# Patient Record
Sex: Female | Born: 1935 | Hispanic: No | Smoking: Never smoker
Health system: Southern US, Community
[De-identification: ages and names within clinical notes are randomized; demographics above are authoritative.]

## PROBLEM LIST (undated history)

## (undated) DIAGNOSIS — F329 Major depressive disorder, single episode, unspecified: Secondary | ICD-10-CM

## (undated) DIAGNOSIS — I10 Essential (primary) hypertension: Secondary | ICD-10-CM

## (undated) DIAGNOSIS — R634 Abnormal weight loss: Secondary | ICD-10-CM

## (undated) DIAGNOSIS — F419 Anxiety disorder, unspecified: Secondary | ICD-10-CM

## (undated) DIAGNOSIS — F0390 Unspecified dementia without behavioral disturbance: Secondary | ICD-10-CM

## (undated) DIAGNOSIS — R413 Other amnesia: Secondary | ICD-10-CM

## (undated) DIAGNOSIS — R269 Unspecified abnormalities of gait and mobility: Secondary | ICD-10-CM

## (undated) DIAGNOSIS — N189 Chronic kidney disease, unspecified: Secondary | ICD-10-CM

## (undated) DIAGNOSIS — F32A Depression, unspecified: Secondary | ICD-10-CM

## (undated) DIAGNOSIS — K810 Acute cholecystitis: Secondary | ICD-10-CM

## (undated) DIAGNOSIS — R42 Dizziness and giddiness: Secondary | ICD-10-CM

## (undated) DIAGNOSIS — B353 Tinea pedis: Secondary | ICD-10-CM

## (undated) HISTORY — PX: CATARACT EXTRACTION: SUR2

## (undated) HISTORY — DX: Major depressive disorder, single episode, unspecified: F32.9

## (undated) HISTORY — DX: Tinea pedis: B35.3

## (undated) HISTORY — DX: Essential (primary) hypertension: I10

## (undated) HISTORY — DX: Abnormal weight loss: R63.4

## (undated) HISTORY — DX: Depression, unspecified: F32.A

## (undated) HISTORY — DX: Unspecified abnormalities of gait and mobility: R26.9

## (undated) HISTORY — DX: Anxiety disorder, unspecified: F41.9

## (undated) HISTORY — DX: Dizziness and giddiness: R42

## (undated) HISTORY — DX: Other amnesia: R41.3

## (undated) HISTORY — PX: TUBAL LIGATION: SHX77

---

## 2000-04-27 ENCOUNTER — Encounter: Payer: Self-pay | Admitting: Cardiology

## 2000-04-27 ENCOUNTER — Ambulatory Visit (HOSPITAL_COMMUNITY): Admission: RE | Admit: 2000-04-27 | Discharge: 2000-04-27 | Payer: Self-pay | Admitting: Cardiology

## 2001-01-17 ENCOUNTER — Ambulatory Visit (HOSPITAL_COMMUNITY): Admission: RE | Admit: 2001-01-17 | Discharge: 2001-01-17 | Payer: Self-pay | Admitting: Cardiology

## 2001-01-17 ENCOUNTER — Encounter: Payer: Self-pay | Admitting: Cardiology

## 2001-04-09 ENCOUNTER — Encounter: Payer: Self-pay | Admitting: Cardiology

## 2001-04-09 ENCOUNTER — Ambulatory Visit (HOSPITAL_COMMUNITY): Admission: RE | Admit: 2001-04-09 | Discharge: 2001-04-09 | Payer: Self-pay | Admitting: Cardiology

## 2001-12-06 ENCOUNTER — Encounter: Payer: Self-pay | Admitting: Cardiology

## 2001-12-06 ENCOUNTER — Ambulatory Visit (HOSPITAL_COMMUNITY): Admission: RE | Admit: 2001-12-06 | Discharge: 2001-12-06 | Payer: Self-pay | Admitting: Cardiology

## 2003-01-03 ENCOUNTER — Ambulatory Visit (HOSPITAL_COMMUNITY): Admission: RE | Admit: 2003-01-03 | Discharge: 2003-01-03 | Payer: Self-pay | Admitting: Family Medicine

## 2003-01-28 ENCOUNTER — Encounter: Admission: RE | Admit: 2003-01-28 | Discharge: 2003-01-28 | Payer: Self-pay | Admitting: Gastroenterology

## 2003-04-28 ENCOUNTER — Encounter (INDEPENDENT_AMBULATORY_CARE_PROVIDER_SITE_OTHER): Payer: Self-pay | Admitting: *Deleted

## 2003-04-28 ENCOUNTER — Ambulatory Visit (HOSPITAL_COMMUNITY): Admission: RE | Admit: 2003-04-28 | Discharge: 2003-04-28 | Payer: Self-pay | Admitting: Gastroenterology

## 2004-07-09 ENCOUNTER — Ambulatory Visit (HOSPITAL_COMMUNITY): Admission: RE | Admit: 2004-07-09 | Discharge: 2004-07-09 | Payer: Self-pay | Admitting: Family Medicine

## 2005-08-04 ENCOUNTER — Ambulatory Visit (HOSPITAL_COMMUNITY): Admission: RE | Admit: 2005-08-04 | Discharge: 2005-08-04 | Payer: Self-pay | Admitting: Family Medicine

## 2005-08-04 ENCOUNTER — Encounter: Payer: Self-pay | Admitting: Vascular Surgery

## 2005-12-07 ENCOUNTER — Ambulatory Visit (HOSPITAL_COMMUNITY): Admission: RE | Admit: 2005-12-07 | Discharge: 2005-12-07 | Payer: Self-pay | Admitting: Gastroenterology

## 2005-12-14 ENCOUNTER — Ambulatory Visit (HOSPITAL_COMMUNITY): Admission: RE | Admit: 2005-12-14 | Discharge: 2005-12-14 | Payer: Self-pay | Admitting: Gastroenterology

## 2008-12-10 ENCOUNTER — Ambulatory Visit (HOSPITAL_COMMUNITY): Admission: RE | Admit: 2008-12-10 | Discharge: 2008-12-10 | Payer: Self-pay | Admitting: Internal Medicine

## 2010-07-16 NOTE — Op Note (Signed)
Dana Tate, Dana Tate               ACCOUNT NO.:  [id number]   MEDICAL RECORD NO.:  [id number]          PATIENT TYPE:  AMB   LOCATION:  ENDO                         FACILITY:  [facility]   PHYSICIAN:  Anselmo Rod, M.D.  DATE OF BIRTH:  03/02/1922   DATE OF PROCEDURE:  12/07/2005  DATE OF DISCHARGE:  12/07/2005                                 OPERATIVE REPORT   PROCEDURE:  Esophagogastroduodenoscopy.   ENDOSCOPIST:  Charna Elizabeth, M.D.   INSTRUMENT USED:  Olympus video panendoscope.   INDICATIONS FOR PROCEDURE:  An 77 year old [place] female with a history of  dysphagia, stricture noticed on a barium swallow.  Undergoing EGD for  possible dilatation.  She also has history of reflux and describes abdominal  pain, predominantly epigastric and periumbilical.   PREPROCEDURE PREPARATION:  Informed consent was procured from the patient.  The patient was fasted for four hours prior to the procedure.  Risks and  benefits of the procedure were discussed with her in great detail.   PREPROCEDURE PHYSICAL:  VITAL SIGNS:  The patient had stable vital signs.  NECK:  Supple.  CHEST:  Clear to auscultation.  CARDIAC:  S1 and S2 regular.  ABDOMEN:  Soft with normal bowel sounds.   DESCRIPTION OF PROCEDURE:  The patient was placed in the left lateral  decubitus position and sedated with 40 mcg of fentanyl  and 5 mg of Versed  in incremental doses.  Once the patient was adequately sedated, maintained  on low-flow oxygen and continuous cardiac monitoring, the Olympus video  panendoscope was advance through the mouth piece over the tongue, into the  esophagus under direct vision.  The entire esophagus appeared somewhat  dilated and had minimal contractions through it.  Question presbyesophagus.  There was large amount of debris in the stomach along the greater curvature  consistent with what appears to be gastroparesis.  The rest of the gastric  mucosa appeared healthy.  There was no other obstruction.  The patient  tolerated the procedure well without immediate complications.  The patient  had a very tortuous esophagus.  There did not seem to be any stricture  present as noted on the barium swallow with 13 mm barium tablet did not  pass.   IMPRESSION:  1. Large dilated esophagus, question presbyesophagus.  2. Large amount of debris in the stomach, question gastroparesis.  3. Normal proximal small bowel.  No outlet obstruction noted.   RECOMMENDATIONS:  1. Gastric emptying studies have been scheduled for the patient next week.  2. She can continue her PPI for now.  3. I do not think she needs a dilatation at this time as the scope passed      easily.  4. A soft, low residue diet has been recommended along with liberal fluid      and very hot or very cold liquids and solids are to be avoided.  5. Outpatient followup after the gastric emptying studies has been done      for further recommendations.      Anselmo Rod, M.D.  Electronically Signed     JNM/MEDQ  D:  12/09/2005  T:  12/11/2005  Job:  [id number]   cc:   Dario Guardian, M.D.

## 2010-07-16 NOTE — Op Note (Signed)
NAME:  Dana Tate, Dana Tate                         ACCOUNT NO.:  [id number]   MEDICAL RECORD NO.:  [id number]                   PATIENT TYPE:  AMB   LOCATION:  ENDO                                 FACILITY:  [facility]   PHYSICIAN:  Anselmo Rod, M.D.               DATE OF BIRTH:  [birth year]   DATE OF PROCEDURE:  04/28/2003  DATE OF DISCHARGE:                                 OPERATIVE REPORT   PROCEDURE PERFORMED:  Esophagogastroduodenoscopy with antral biopsies.   ENDOSCOPIST:  Charna Elizabeth, M.D.   INSTRUMENT USED:  Olympus video panendoscope.   INDICATIONS FOR PROCEDURE:  The patient is a 77 year old [place]  female with a history of dysphagia and guaiac positive stools.  She also has  epigastric pain.  She had a barium swallow done that showed a small amount  of reflux with possible mild distal esophageal stricture.  There was some  delay in the passage of a 13 mm barium tablet.   PREPROCEDURE PREPARATION:  Informed consent was procured from the patient.  The patient was fasted for eight hours prior to the procedure.   PREPROCEDURE PHYSICAL:  The patient had stable vital signs.  Neck supple,  chest clear to auscultation.  S1, S2 regular.  Abdomen soft with normal  bowel sounds.   DESCRIPTION OF PROCEDURE:  The patient was placed in the left lateral  decubitus position and sedated with 50 mg of Demerol and 5 mg of Versed in  slow incremental doses.  Once the patient was adequately sedated and  maintained on low-flow oxygen and continuous cardiac monitoring, the Olympus  video panendoscope was advanced through the mouth piece over the tongue into  the esophagus under direct vision.  The esophagus seemed somewhat tortuous  with no evidence of a distal esophageal stricture, esophagitis or Barrett's  mucosa.  The scope was then advanced to the stomach.  A small hiatal hernia  was seen on high retroflexion.  There was prominent antral gastritis noted.  Extrinsic compression of the  midbody of the stomach was noted.  Reasons for  this were not clear but it seemed to be moving with respiration.  Antral  biopsies done to rule out presence of Helicobacter pylori by pathology.  Proximal small bowel appeared normal.  There was no outlet obstruction.  The  patient tolerated the procedure well without complications.   IMPRESSION:  1. Tortuous esophagus, no stricture seen.  2. No evidence of Barrett's mucosa or esophagitis.  3. Hiatal hernia present on high retroflexion.  4. Antral gastritis, biopsies done.  5. Normal proximal small bowel.  6. Extrinsic compression of stomach question etiology.   RECOMMENDATIONS:  1. Continue proton pump inhibitor.  2. Treat with antibiotic if Helicobacter pylori present on biopsies.  3. Proceed with colonoscopy at this time.  4. CT scan of the abdomen and pelvis if this has not been done in the past.  5. Further recommendations  to be made after colonoscopy has been done.                                               Anselmo Rod, M.D.    JNM/MEDQ  D:  04/28/2003  T:  04/28/2003  Job:  [id number]   cc:   Eduardo Osier. Sharyn Lull, M.D.  [address]  [place]  [place] [address]  Fax: [phone]   Dario Guardian, M.D.  [address]  [place]  [place] [address]  Fax: [phone]

## 2010-07-16 NOTE — Op Note (Signed)
NAME:  Dana Tate, Dana Tate                         ACCOUNT NO.:  [id number]   MEDICAL RECORD NO.:  [id number]                   PATIENT TYPE:  AMB   LOCATION:  ENDO                                 FACILITY:  [facility]   PHYSICIAN:  Anselmo Rod, M.D.               DATE OF BIRTH:  1923-01-29   DATE OF PROCEDURE:  04/28/2003  DATE OF DISCHARGE:                                 OPERATIVE REPORT   PROCEDURE PERFORMED:  Screening colonoscopy.   ENDOSCOPIST:  Anselmo Rod, M.D.   INSTRUMENT USED:  Olympus video colonoscope.   INDICATION FOR PROCEDURE:  A 77 year old Asian-Indian female undergoing  screening colonoscopy.  The patient was found to have guaiac-positive stools  on her physical exam in the office.   PREPROCEDURE PREPARATION:  Informed consent was procured from the patient.  The patient fasted for eight hours prior to the procedure.  She was prepped  with a bottle of magnesium citrate and a gallon of GoLYTELY the night prior  to the procedure.   PREPROCEDURE PHYSICAL EXAMINATION:  VITAL SIGNS:  Stable.  NECK:  Supple.  CHEST:  Clear to auscultation.  HEART:  S1 and S2 regular.  ABDOMEN:  Soft with normal bowel sounds.   DESCRIPTION OF PROCEDURE:  The patient was placed in the left lateral  decubitus position.  Sedated with 4 mg of Demerol and 4 mg of Versed for the  EGD.  No additional sedation was used for the colonoscopy.  Once the patient  was adequately sedated and maintained on low-flow oxygen and continuous  cardiac monitoring, the Olympus video colonoscope was advanced from the  rectum to the cecum with difficulty.  There was solid stool in different  areas of the colon.  Multiple washings were done.  No masses, polyps,  erosions, ulcerations, or diverticula were seen.  Retroflexion in the rectum  revealed small internal hemorrhoids.  The patient tolerated the procedure  well without complications.   IMPRESSION:  1. Unrevealing colonoscopy, except for small  internal hemorrhoids.  No     masses or polyps seen.  2. Large amount of residual stool in the colon.  Multiple washings done.     Small lesions could have been missed.   RECOMMENDATIONS:  1. Continue on a high fiber diet.  2. Repeat guaiac testing will be done on an outpatient basis and further     recommendations will be made as needed.                                               Anselmo Rod, M.D.    JNM/MEDQ  D:  04/28/2003  T:  04/28/2003  Job:  [id number]   cc:   Eduardo Osier. Sharyn Lull, M.D.  [address]  [place]  [place] [address]  Fax: [phone]   Dario Guardian, M.D.  [address]  [place]  [place] [address]  Fax: [phone]

## 2010-07-16 NOTE — Op Note (Signed)
NAME:  Dana Tate, Dana Tate                         ACCOUNT NO.:  [id number]   MEDICAL RECORD NO.:  [id number]                   PATIENT TYPE:  AMB   LOCATION:  ENDO                                 FACILITY:  [facility]   PHYSICIAN:  Anselmo Rod, M.D.               DATE OF BIRTH:  02-19-1923   DATE OF PROCEDURE:  04/28/2003  DATE OF DISCHARGE:                                 OPERATIVE REPORT   PROCEDURE PERFORMED:  Screening colonoscopy.   ENDOSCOPIST:  Charna Elizabeth, M.D.   INSTRUMENT USED:  Olympus video colonoscope.   INDICATIONS FOR PROCEDURE:  Guaiac positive stools in a 77 year old  DICTATION ENDED HERE.                                               Anselmo Rod, M.D.    JNM/MEDQ  D:  04/28/2003  T:  04/28/2003  Job:  [id number]   cc:   Eduardo Osier. Sharyn Lull, M.D.  [address]  [place]  [place] [address]  Fax: [phone]   Dario Guardian, M.D.  [address]  [place]  [place] [address]  Fax: [phone]

## 2011-03-15 DIAGNOSIS — M545 Low back pain: Secondary | ICD-10-CM | POA: Diagnosis not present

## 2011-03-15 DIAGNOSIS — M159 Polyosteoarthritis, unspecified: Secondary | ICD-10-CM | POA: Diagnosis not present

## 2011-04-06 DIAGNOSIS — H26499 Other secondary cataract, unspecified eye: Secondary | ICD-10-CM | POA: Diagnosis not present

## 2011-04-07 DIAGNOSIS — R5381 Other malaise: Secondary | ICD-10-CM | POA: Diagnosis not present

## 2011-04-13 DIAGNOSIS — R42 Dizziness and giddiness: Secondary | ICD-10-CM | POA: Diagnosis not present

## 2011-04-13 DIAGNOSIS — I69998 Other sequelae following unspecified cerebrovascular disease: Secondary | ICD-10-CM | POA: Diagnosis not present

## 2011-04-14 DIAGNOSIS — I10 Essential (primary) hypertension: Secondary | ICD-10-CM | POA: Diagnosis not present

## 2011-04-14 DIAGNOSIS — R42 Dizziness and giddiness: Secondary | ICD-10-CM | POA: Diagnosis not present

## 2011-04-15 DIAGNOSIS — I69998 Other sequelae following unspecified cerebrovascular disease: Secondary | ICD-10-CM | POA: Diagnosis not present

## 2011-04-18 DIAGNOSIS — I10 Essential (primary) hypertension: Secondary | ICD-10-CM | POA: Diagnosis not present

## 2011-04-19 DIAGNOSIS — I69998 Other sequelae following unspecified cerebrovascular disease: Secondary | ICD-10-CM | POA: Diagnosis not present

## 2011-04-22 DIAGNOSIS — R42 Dizziness and giddiness: Secondary | ICD-10-CM | POA: Diagnosis not present

## 2011-04-25 DIAGNOSIS — R42 Dizziness and giddiness: Secondary | ICD-10-CM | POA: Diagnosis not present

## 2011-04-25 DIAGNOSIS — I69998 Other sequelae following unspecified cerebrovascular disease: Secondary | ICD-10-CM | POA: Diagnosis not present

## 2011-05-13 DIAGNOSIS — R42 Dizziness and giddiness: Secondary | ICD-10-CM | POA: Diagnosis not present

## 2011-05-13 DIAGNOSIS — I69998 Other sequelae following unspecified cerebrovascular disease: Secondary | ICD-10-CM | POA: Diagnosis not present

## 2011-05-17 DIAGNOSIS — I69998 Other sequelae following unspecified cerebrovascular disease: Secondary | ICD-10-CM | POA: Diagnosis not present

## 2011-05-17 DIAGNOSIS — R42 Dizziness and giddiness: Secondary | ICD-10-CM | POA: Diagnosis not present

## 2011-05-20 DIAGNOSIS — I69998 Other sequelae following unspecified cerebrovascular disease: Secondary | ICD-10-CM | POA: Diagnosis not present

## 2011-07-11 DIAGNOSIS — I69998 Other sequelae following unspecified cerebrovascular disease: Secondary | ICD-10-CM | POA: Diagnosis not present

## 2011-07-11 DIAGNOSIS — R42 Dizziness and giddiness: Secondary | ICD-10-CM | POA: Diagnosis not present

## 2011-07-13 DIAGNOSIS — R42 Dizziness and giddiness: Secondary | ICD-10-CM | POA: Diagnosis not present

## 2011-07-13 DIAGNOSIS — I69998 Other sequelae following unspecified cerebrovascular disease: Secondary | ICD-10-CM | POA: Diagnosis not present

## 2011-08-30 DIAGNOSIS — M810 Age-related osteoporosis without current pathological fracture: Secondary | ICD-10-CM | POA: Diagnosis not present

## 2011-08-30 DIAGNOSIS — R1310 Dysphagia, unspecified: Secondary | ICD-10-CM | POA: Diagnosis not present

## 2011-08-30 DIAGNOSIS — K649 Unspecified hemorrhoids: Secondary | ICD-10-CM | POA: Diagnosis not present

## 2011-08-30 DIAGNOSIS — M1990 Unspecified osteoarthritis, unspecified site: Secondary | ICD-10-CM | POA: Diagnosis not present

## 2011-08-30 DIAGNOSIS — K5900 Constipation, unspecified: Secondary | ICD-10-CM | POA: Diagnosis present

## 2011-08-30 DIAGNOSIS — E639 Nutritional deficiency, unspecified: Secondary | ICD-10-CM | POA: Diagnosis not present

## 2011-08-30 DIAGNOSIS — M129 Arthropathy, unspecified: Secondary | ICD-10-CM | POA: Diagnosis not present

## 2011-08-30 DIAGNOSIS — D649 Anemia, unspecified: Secondary | ICD-10-CM | POA: Diagnosis not present

## 2011-08-30 DIAGNOSIS — R070 Pain in throat: Secondary | ICD-10-CM | POA: Diagnosis not present

## 2011-08-30 DIAGNOSIS — R252 Cramp and spasm: Secondary | ICD-10-CM | POA: Diagnosis not present

## 2011-09-01 DIAGNOSIS — M545 Low back pain, unspecified: Secondary | ICD-10-CM | POA: Diagnosis not present

## 2011-09-01 DIAGNOSIS — R269 Unspecified abnormalities of gait and mobility: Secondary | ICD-10-CM | POA: Diagnosis not present

## 2011-09-01 DIAGNOSIS — R42 Dizziness and giddiness: Secondary | ICD-10-CM | POA: Diagnosis not present

## 2011-09-01 DIAGNOSIS — M129 Arthropathy, unspecified: Secondary | ICD-10-CM | POA: Diagnosis not present

## 2011-09-01 DIAGNOSIS — M25519 Pain in unspecified shoulder: Secondary | ICD-10-CM | POA: Diagnosis not present

## 2011-09-01 DIAGNOSIS — R279 Unspecified lack of coordination: Secondary | ICD-10-CM | POA: Diagnosis not present

## 2011-09-08 DIAGNOSIS — M1990 Unspecified osteoarthritis, unspecified site: Secondary | ICD-10-CM | POA: Diagnosis not present

## 2011-09-08 DIAGNOSIS — Z9181 History of falling: Secondary | ICD-10-CM | POA: Diagnosis not present

## 2011-09-08 DIAGNOSIS — M129 Arthropathy, unspecified: Secondary | ICD-10-CM | POA: Diagnosis not present

## 2011-09-08 DIAGNOSIS — M810 Age-related osteoporosis without current pathological fracture: Secondary | ICD-10-CM | POA: Diagnosis not present

## 2011-09-27 DIAGNOSIS — K5900 Constipation, unspecified: Secondary | ICD-10-CM | POA: Diagnosis not present

## 2011-09-27 DIAGNOSIS — K649 Unspecified hemorrhoids: Secondary | ICD-10-CM | POA: Diagnosis not present

## 2011-09-29 DIAGNOSIS — M129 Arthropathy, unspecified: Secondary | ICD-10-CM | POA: Diagnosis not present

## 2011-09-29 DIAGNOSIS — Z9181 History of falling: Secondary | ICD-10-CM | POA: Diagnosis not present

## 2011-09-29 DIAGNOSIS — M810 Age-related osteoporosis without current pathological fracture: Secondary | ICD-10-CM | POA: Diagnosis not present

## 2011-09-29 DIAGNOSIS — M1990 Unspecified osteoarthritis, unspecified site: Secondary | ICD-10-CM | POA: Diagnosis not present

## 2011-10-25 DIAGNOSIS — H905 Unspecified sensorineural hearing loss: Secondary | ICD-10-CM | POA: Diagnosis not present

## 2011-11-08 DIAGNOSIS — Z23 Encounter for immunization: Secondary | ICD-10-CM | POA: Diagnosis not present

## 2011-11-08 DIAGNOSIS — R209 Unspecified disturbances of skin sensation: Secondary | ICD-10-CM | POA: Diagnosis not present

## 2011-11-08 DIAGNOSIS — R6889 Other general symptoms and signs: Secondary | ICD-10-CM | POA: Diagnosis not present

## 2011-11-15 DIAGNOSIS — R0982 Postnasal drip: Secondary | ICD-10-CM | POA: Diagnosis not present

## 2011-11-15 DIAGNOSIS — J029 Acute pharyngitis, unspecified: Secondary | ICD-10-CM | POA: Diagnosis not present

## 2011-11-15 DIAGNOSIS — J309 Allergic rhinitis, unspecified: Secondary | ICD-10-CM | POA: Diagnosis not present

## 2012-02-06 DIAGNOSIS — H269 Unspecified cataract: Secondary | ICD-10-CM | POA: Diagnosis not present

## 2012-02-06 DIAGNOSIS — R6889 Other general symptoms and signs: Secondary | ICD-10-CM | POA: Diagnosis not present

## 2012-02-06 DIAGNOSIS — R799 Abnormal finding of blood chemistry, unspecified: Secondary | ICD-10-CM | POA: Diagnosis not present

## 2012-02-06 DIAGNOSIS — E639 Nutritional deficiency, unspecified: Secondary | ICD-10-CM | POA: Diagnosis not present

## 2012-02-06 DIAGNOSIS — E440 Moderate protein-calorie malnutrition: Secondary | ICD-10-CM | POA: Diagnosis not present

## 2012-02-06 DIAGNOSIS — R350 Frequency of micturition: Secondary | ICD-10-CM | POA: Diagnosis not present

## 2012-02-06 DIAGNOSIS — K5900 Constipation, unspecified: Secondary | ICD-10-CM | POA: Diagnosis not present

## 2012-02-06 DIAGNOSIS — R1310 Dysphagia, unspecified: Secondary | ICD-10-CM | POA: Diagnosis not present

## 2012-02-24 DIAGNOSIS — H35039 Hypertensive retinopathy, unspecified eye: Secondary | ICD-10-CM | POA: Diagnosis not present

## 2012-02-24 DIAGNOSIS — Z961 Presence of intraocular lens: Secondary | ICD-10-CM | POA: Diagnosis not present

## 2012-02-24 DIAGNOSIS — H10509 Unspecified blepharoconjunctivitis, unspecified eye: Secondary | ICD-10-CM | POA: Diagnosis not present

## 2012-02-24 DIAGNOSIS — H26499 Other secondary cataract, unspecified eye: Secondary | ICD-10-CM | POA: Diagnosis not present

## 2012-02-24 DIAGNOSIS — H40029 Open angle with borderline findings, high risk, unspecified eye: Secondary | ICD-10-CM | POA: Diagnosis not present

## 2012-03-12 DIAGNOSIS — B354 Tinea corporis: Secondary | ICD-10-CM | POA: Diagnosis not present

## 2012-03-12 DIAGNOSIS — R030 Elevated blood-pressure reading, without diagnosis of hypertension: Secondary | ICD-10-CM | POA: Diagnosis not present

## 2012-04-09 DIAGNOSIS — B354 Tinea corporis: Secondary | ICD-10-CM | POA: Diagnosis not present

## 2012-04-09 DIAGNOSIS — L258 Unspecified contact dermatitis due to other agents: Secondary | ICD-10-CM | POA: Diagnosis not present

## 2012-04-09 DIAGNOSIS — K5900 Constipation, unspecified: Secondary | ICD-10-CM | POA: Diagnosis not present

## 2012-04-09 DIAGNOSIS — K649 Unspecified hemorrhoids: Secondary | ICD-10-CM | POA: Diagnosis not present

## 2012-04-30 DIAGNOSIS — R1312 Dysphagia, oropharyngeal phase: Secondary | ICD-10-CM | POA: Diagnosis not present

## 2012-05-03 DIAGNOSIS — R030 Elevated blood-pressure reading, without diagnosis of hypertension: Secondary | ICD-10-CM | POA: Diagnosis not present

## 2012-05-03 DIAGNOSIS — K649 Unspecified hemorrhoids: Secondary | ICD-10-CM | POA: Diagnosis not present

## 2012-05-09 DIAGNOSIS — B358 Other dermatophytoses: Secondary | ICD-10-CM | POA: Diagnosis not present

## 2012-05-09 DIAGNOSIS — R1312 Dysphagia, oropharyngeal phase: Secondary | ICD-10-CM | POA: Diagnosis not present

## 2012-05-18 DIAGNOSIS — B358 Other dermatophytoses: Secondary | ICD-10-CM | POA: Diagnosis not present

## 2012-05-18 DIAGNOSIS — M25569 Pain in unspecified knee: Secondary | ICD-10-CM | POA: Diagnosis not present

## 2012-05-18 DIAGNOSIS — R262 Difficulty in walking, not elsewhere classified: Secondary | ICD-10-CM | POA: Diagnosis not present

## 2012-05-18 DIAGNOSIS — K5900 Constipation, unspecified: Secondary | ICD-10-CM | POA: Diagnosis not present

## 2012-05-18 DIAGNOSIS — M1990 Unspecified osteoarthritis, unspecified site: Secondary | ICD-10-CM | POA: Diagnosis not present

## 2012-05-18 DIAGNOSIS — R1312 Dysphagia, oropharyngeal phase: Secondary | ICD-10-CM | POA: Diagnosis not present

## 2012-05-21 DIAGNOSIS — R1312 Dysphagia, oropharyngeal phase: Secondary | ICD-10-CM | POA: Diagnosis not present

## 2012-05-21 DIAGNOSIS — M1990 Unspecified osteoarthritis, unspecified site: Secondary | ICD-10-CM | POA: Diagnosis not present

## 2012-05-21 DIAGNOSIS — B358 Other dermatophytoses: Secondary | ICD-10-CM | POA: Diagnosis not present

## 2012-05-21 DIAGNOSIS — K5900 Constipation, unspecified: Secondary | ICD-10-CM | POA: Diagnosis not present

## 2012-05-21 DIAGNOSIS — R262 Difficulty in walking, not elsewhere classified: Secondary | ICD-10-CM | POA: Diagnosis not present

## 2012-05-21 DIAGNOSIS — M25569 Pain in unspecified knee: Secondary | ICD-10-CM | POA: Diagnosis not present

## 2012-05-22 DIAGNOSIS — M1990 Unspecified osteoarthritis, unspecified site: Secondary | ICD-10-CM | POA: Diagnosis not present

## 2012-05-22 DIAGNOSIS — K5900 Constipation, unspecified: Secondary | ICD-10-CM | POA: Diagnosis not present

## 2012-05-22 DIAGNOSIS — B358 Other dermatophytoses: Secondary | ICD-10-CM | POA: Diagnosis not present

## 2012-05-22 DIAGNOSIS — R262 Difficulty in walking, not elsewhere classified: Secondary | ICD-10-CM | POA: Diagnosis not present

## 2012-05-22 DIAGNOSIS — R1312 Dysphagia, oropharyngeal phase: Secondary | ICD-10-CM | POA: Diagnosis not present

## 2012-05-22 DIAGNOSIS — M25569 Pain in unspecified knee: Secondary | ICD-10-CM | POA: Diagnosis not present

## 2012-05-23 DIAGNOSIS — M25569 Pain in unspecified knee: Secondary | ICD-10-CM | POA: Diagnosis not present

## 2012-05-23 DIAGNOSIS — B358 Other dermatophytoses: Secondary | ICD-10-CM | POA: Diagnosis not present

## 2012-05-23 DIAGNOSIS — M1990 Unspecified osteoarthritis, unspecified site: Secondary | ICD-10-CM | POA: Diagnosis not present

## 2012-05-23 DIAGNOSIS — R1312 Dysphagia, oropharyngeal phase: Secondary | ICD-10-CM | POA: Diagnosis not present

## 2012-05-23 DIAGNOSIS — K5900 Constipation, unspecified: Secondary | ICD-10-CM | POA: Diagnosis not present

## 2012-05-23 DIAGNOSIS — R262 Difficulty in walking, not elsewhere classified: Secondary | ICD-10-CM | POA: Diagnosis not present

## 2012-05-29 DIAGNOSIS — M1990 Unspecified osteoarthritis, unspecified site: Secondary | ICD-10-CM | POA: Diagnosis not present

## 2012-05-29 DIAGNOSIS — B358 Other dermatophytoses: Secondary | ICD-10-CM | POA: Diagnosis not present

## 2012-05-29 DIAGNOSIS — M25569 Pain in unspecified knee: Secondary | ICD-10-CM | POA: Diagnosis not present

## 2012-05-29 DIAGNOSIS — R1312 Dysphagia, oropharyngeal phase: Secondary | ICD-10-CM | POA: Diagnosis not present

## 2012-05-29 DIAGNOSIS — R262 Difficulty in walking, not elsewhere classified: Secondary | ICD-10-CM | POA: Diagnosis not present

## 2012-05-29 DIAGNOSIS — K5900 Constipation, unspecified: Secondary | ICD-10-CM | POA: Diagnosis not present

## 2012-06-05 DIAGNOSIS — H04129 Dry eye syndrome of unspecified lacrimal gland: Secondary | ICD-10-CM | POA: Diagnosis not present

## 2012-06-05 DIAGNOSIS — H40029 Open angle with borderline findings, high risk, unspecified eye: Secondary | ICD-10-CM | POA: Diagnosis not present

## 2012-06-05 DIAGNOSIS — H524 Presbyopia: Secondary | ICD-10-CM | POA: Diagnosis not present

## 2013-11-29 DIAGNOSIS — R410 Disorientation, unspecified: Secondary | ICD-10-CM | POA: Diagnosis not present

## 2013-11-29 DIAGNOSIS — R29818 Other symptoms and signs involving the nervous system: Secondary | ICD-10-CM | POA: Diagnosis not present

## 2013-11-29 DIAGNOSIS — M810 Age-related osteoporosis without current pathological fracture: Secondary | ICD-10-CM | POA: Diagnosis not present

## 2013-11-29 DIAGNOSIS — F0390 Unspecified dementia without behavioral disturbance: Secondary | ICD-10-CM | POA: Diagnosis not present

## 2014-02-18 DIAGNOSIS — B353 Tinea pedis: Secondary | ICD-10-CM | POA: Diagnosis not present

## 2014-02-18 DIAGNOSIS — Z23 Encounter for immunization: Secondary | ICD-10-CM | POA: Diagnosis not present

## 2014-02-18 DIAGNOSIS — R42 Dizziness and giddiness: Secondary | ICD-10-CM | POA: Diagnosis not present

## 2014-02-18 DIAGNOSIS — R269 Unspecified abnormalities of gait and mobility: Secondary | ICD-10-CM | POA: Diagnosis not present

## 2014-03-03 DIAGNOSIS — Z0000 Encounter for general adult medical examination without abnormal findings: Secondary | ICD-10-CM | POA: Diagnosis not present

## 2014-03-03 DIAGNOSIS — R42 Dizziness and giddiness: Secondary | ICD-10-CM | POA: Diagnosis not present

## 2014-03-03 DIAGNOSIS — I10 Essential (primary) hypertension: Secondary | ICD-10-CM | POA: Diagnosis not present

## 2014-03-03 DIAGNOSIS — K219 Gastro-esophageal reflux disease without esophagitis: Secondary | ICD-10-CM | POA: Diagnosis not present

## 2014-03-05 DIAGNOSIS — M4004 Postural kyphosis, thoracic region: Secondary | ICD-10-CM | POA: Diagnosis not present

## 2014-03-05 DIAGNOSIS — R42 Dizziness and giddiness: Secondary | ICD-10-CM | POA: Diagnosis not present

## 2014-03-05 DIAGNOSIS — R269 Unspecified abnormalities of gait and mobility: Secondary | ICD-10-CM | POA: Diagnosis not present

## 2014-03-05 DIAGNOSIS — M6281 Muscle weakness (generalized): Secondary | ICD-10-CM | POA: Diagnosis not present

## 2014-03-07 DIAGNOSIS — R634 Abnormal weight loss: Secondary | ICD-10-CM | POA: Diagnosis not present

## 2014-03-07 DIAGNOSIS — R413 Other amnesia: Secondary | ICD-10-CM | POA: Diagnosis not present

## 2014-03-07 DIAGNOSIS — B353 Tinea pedis: Secondary | ICD-10-CM | POA: Diagnosis not present

## 2014-03-11 DIAGNOSIS — R42 Dizziness and giddiness: Secondary | ICD-10-CM | POA: Diagnosis not present

## 2014-03-11 DIAGNOSIS — R269 Unspecified abnormalities of gait and mobility: Secondary | ICD-10-CM | POA: Diagnosis not present

## 2014-03-11 DIAGNOSIS — M4004 Postural kyphosis, thoracic region: Secondary | ICD-10-CM | POA: Diagnosis not present

## 2014-03-11 DIAGNOSIS — M6281 Muscle weakness (generalized): Secondary | ICD-10-CM | POA: Diagnosis not present

## 2014-03-13 DIAGNOSIS — M4004 Postural kyphosis, thoracic region: Secondary | ICD-10-CM | POA: Diagnosis not present

## 2014-03-13 DIAGNOSIS — R42 Dizziness and giddiness: Secondary | ICD-10-CM | POA: Diagnosis not present

## 2014-03-13 DIAGNOSIS — M6281 Muscle weakness (generalized): Secondary | ICD-10-CM | POA: Diagnosis not present

## 2014-03-13 DIAGNOSIS — R269 Unspecified abnormalities of gait and mobility: Secondary | ICD-10-CM | POA: Diagnosis not present

## 2014-03-17 ENCOUNTER — Other Ambulatory Visit: Payer: Self-pay | Admitting: Internal Medicine

## 2014-03-17 DIAGNOSIS — R413 Other amnesia: Secondary | ICD-10-CM

## 2014-03-18 DIAGNOSIS — M6281 Muscle weakness (generalized): Secondary | ICD-10-CM | POA: Diagnosis not present

## 2014-03-18 DIAGNOSIS — M4004 Postural kyphosis, thoracic region: Secondary | ICD-10-CM | POA: Diagnosis not present

## 2014-03-18 DIAGNOSIS — R42 Dizziness and giddiness: Secondary | ICD-10-CM | POA: Diagnosis not present

## 2014-03-18 DIAGNOSIS — R269 Unspecified abnormalities of gait and mobility: Secondary | ICD-10-CM | POA: Diagnosis not present

## 2014-03-20 DIAGNOSIS — R269 Unspecified abnormalities of gait and mobility: Secondary | ICD-10-CM | POA: Diagnosis not present

## 2014-03-20 DIAGNOSIS — M4004 Postural kyphosis, thoracic region: Secondary | ICD-10-CM | POA: Diagnosis not present

## 2014-03-20 DIAGNOSIS — M6281 Muscle weakness (generalized): Secondary | ICD-10-CM | POA: Diagnosis not present

## 2014-03-20 DIAGNOSIS — R42 Dizziness and giddiness: Secondary | ICD-10-CM | POA: Diagnosis not present

## 2014-03-23 ENCOUNTER — Ambulatory Visit
Admission: RE | Admit: 2014-03-23 | Discharge: 2014-03-23 | Disposition: A | Discharge status: Home / Self Care | Payer: Self-pay | Source: Ambulatory Visit | Attending: Internal Medicine | Admitting: Internal Medicine

## 2014-03-23 DIAGNOSIS — R42 Dizziness and giddiness: Secondary | ICD-10-CM | POA: Diagnosis not present

## 2014-03-23 DIAGNOSIS — R413 Other amnesia: Secondary | ICD-10-CM

## 2014-03-24 ENCOUNTER — Encounter: Payer: Self-pay | Admitting: Neurology

## 2014-03-25 ENCOUNTER — Ambulatory Visit: Payer: Self-pay | Admitting: Neurology

## 2014-03-25 ENCOUNTER — Ambulatory Visit: Payer: Medicare Other | Admitting: Neurology

## 2014-03-27 DIAGNOSIS — R42 Dizziness and giddiness: Secondary | ICD-10-CM | POA: Diagnosis not present

## 2014-03-27 DIAGNOSIS — M4004 Postural kyphosis, thoracic region: Secondary | ICD-10-CM | POA: Diagnosis not present

## 2014-03-27 DIAGNOSIS — M6281 Muscle weakness (generalized): Secondary | ICD-10-CM | POA: Diagnosis not present

## 2014-03-27 DIAGNOSIS — R269 Unspecified abnormalities of gait and mobility: Secondary | ICD-10-CM | POA: Diagnosis not present

## 2014-04-01 ENCOUNTER — Ambulatory Visit (INDEPENDENT_AMBULATORY_CARE_PROVIDER_SITE_OTHER): Payer: Medicare Other | Admitting: Neurology

## 2014-04-01 ENCOUNTER — Encounter: Payer: Self-pay | Admitting: Neurology

## 2014-04-01 VITALS — BP 175/100 | HR 99 | Temp 97.1°F | Ht <= 58 in | Wt 85.0 lb

## 2014-04-01 DIAGNOSIS — F0391 Unspecified dementia with behavioral disturbance: Secondary | ICD-10-CM | POA: Diagnosis not present

## 2014-04-01 DIAGNOSIS — F329 Major depressive disorder, single episode, unspecified: Secondary | ICD-10-CM | POA: Diagnosis not present

## 2014-04-01 DIAGNOSIS — F03918 Unspecified dementia, unspecified severity, with other behavioral disturbance: Secondary | ICD-10-CM

## 2014-04-01 DIAGNOSIS — F32A Depression, unspecified: Secondary | ICD-10-CM

## 2014-04-01 NOTE — Progress Notes (Signed)
Subjective:    Patient ID: Rexene AlbertsKumud L Mccrory is a 81 y.o. female.  HPI     Huston FoleySaima Millenia Waldvogel, MD, PhD [facility] [address], [address] [address] [place], [place] [address]  Dear Dr. Renne CriglerPharr,   I saw your patient, Prudy FeelerKumud Lamagna, upon your kind request in my neurologic clinic today for initial consultation of her memory loss. The patient is accompanied by her daughter, Dionicio StallUsha, today. As you know, Ms. Loney LohRathore is an 81 year old right-handed woman with an underlying medical history of reflux disease, who has had memory loss. Her family also noted some personality changes and irritability. Recent blood work from 03/03/2014 showed BUN of 30, creatinine of 1.7, sodium of 143, total cholesterol of 234, with calculated LDL of 147, urinalysis with trace blood, otherwise negative. She was started on Aricept 5 mg strength recently on 03/07/2014, but she refused to take it. She recently had a brain MRI without contrast on 03/23/2014: Moderate generalized atrophy with moderate chronic microvascular ischemia. No acute infarct or mass lesion. Chronic areas of micro hemorrhage, question history of hypertension.   In addition, personally reviewed the images through the PACS system.  She has had memory loss for years, worst since last year. She has been living in [place] for over [place] years. 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  She is widowed for over 40 years and lives with her daughter, who herself is 79 yo!. She has always been very independent and a strong personality and has always been very active, playing badminton, teaching singing. She is a Education administratorpainter. She was last in [place] for 6 months till 8/15.  She has had some personality changes, but mainly irritability, anger, frustration. She has had symptoms of depression. Sometimes she feels paranoid.    Her Past Medical History Is Significant  For: Past Medical History  Diagnosis Date  . Memory loss   . Tinea pedis   . Weight loss   . Gait abnormality   . Dizziness   . Hypertension   . Depression   . Anxiety     Her Past Surgical History Is Significant For: Past Surgical History  Procedure Laterality Date  . Cataract extraction    . Tubal ligation      Her Family History Is Significant For: Family History  Problem Relation Age of Onset  . Heart attack Father     Her Social History Is Significant For: History   Social History  . Marital Status: Widowed    Spouse Name: N/A    Number of Children: 1  . Years of Education: college   Occupational History  .      retired   Social History Main Topics  . Smoking status: Never Smoker   . Smokeless tobacco: Never Used  . Alcohol Use: No  . Drug Use: No  . Sexual Activity: None   Other Topics Concern  . None   Social History Narrative    Her Allergies Are:  Allergies  Allergen Reactions  . Sulfa Antibiotics Itching and Rash  . Penicillins Nausea Only and Other (See Comments)  :   Her Current Medications Are:  Outpatient Encounter Prescriptions as of 04/01/2014  Medication Sig  . acetaminophen (TYLENOL) 650 MG CR tablet Take 650 mg by mouth.  . feeding supplement, ENSURE, (ENSURE) PUDG Take 1 Container by mouth.  . Multiple Vitamin (MULTIVITAMIN) capsule  Take 1 capsule by mouth daily.  . calcium carbonate (OS-CAL) 600 MG TABS tablet Take 600 mg by mouth.  . diclofenac sodium (VOLTAREN) 1 % GEL Place 2 g onto the skin.  . Nutritional Supplements (COMPLETE NUTRITION PLUS) LIQD Take 1 Can by mouth.  . polyethylene glycol (MIRALAX / GLYCOLAX) packet Take 17 g by mouth.  :  Review of Systems:  Out of a complete 14 point review of systems, all are reviewed and negative with the exception of these symptoms as listed below:    Review of Systems  Constitutional: Positive for fatigue.  HENT: Positive for hearing loss.   Respiratory: Positive for  shortness of breath.   Gastrointestinal:       Incontinence  Musculoskeletal:       Joint pain, aching muscles  Skin:       itching  Neurological:       Memory loss, confusion, headaches, weakness, dizziness, sleepiness  Psychiatric/Behavioral:       Depression, anxiety, too much sleep, disinterest in activities, hallucinations    Objective:  Neurologic Exam  Physical Exam Physical Examination:   Filed Vitals:   04/01/14 0848  BP: 175/100  Pulse: 99  Temp: 97.1 F (36.2 C)    General Examination: The patient is a very pleasant 81 y.o. female in no acute distress. She is calm and cooperative with the exam. She denies Auditory Hallucinations and Visual Hallucinations. She is adequately groomed and situated in a chair. She is thin and frail appearing.  HEENT: Normocephalic, atraumatic, pupils are equal, round and reactive to light and accommodation. Funduscopic exam is normal with sharp disc margins noted. She has difficulty tracking. Hearing is mildly impaired. Face is symmetric. Speech is slightly hypophonic but easy to understand. There is normal facial sensation. There is no lip, neck or jaw tremor. Neck is not rigid with intact passive ROM. There are no carotid bruits on auscultation. Oropharynx exam reveals moderate mouth dryness. She has full dentures. No significant airway crowding is noted. Mallampati is class II. Tongue protrudes centrally and palate elevates symmetrically.    Chest: is clear to auscultation without wheezing, rhonchi or crackles noted.  Heart: sounds are regular and normal without murmurs, rubs or gallops noted.   Abdomen: is soft, non-tender and non-distended with normal bowel sounds appreciated on auscultation.  Extremities: There is no pitting edema in the distal lower extremities bilaterally. Pedal pulses are intact.   Skin: is warm and dry with no trophic changes noted. Age-related changes are noted on the skin.   Musculoskeletal: exam reveals no  obvious joint deformities, tenderness or joint swelling or erythema. Changes consistent with OA of the hands are noted bilaterally.   Neurologically:  Mental status: The patient is awake and alert, paying fair  attention. She is able to provide little of the history. Her daughter provides almost the entire history. She is oriented to: person and situation. Her memory, attention, language and knowledge are impaired. There is no aphasia, agnosia, apraxia or anomia. She has to be redirected multiple times. She does repeat herself several times and asks the same questions several times. There is a mild degree of bradyphrenia. Speech is mildly hypophonic with no dysarthria noted. Mood is congruent and affect is blunted and constricted.  Her MMSE (Mini-Mental state exam) score is 11/30.  CDT (Clock Drawing Test) score is 2/4.  AFT (Animal Fluency Test) score is 2.  Cranial nerves are as described above under HEENT exam. In addition, shoulder shrug is normal  with equal shoulder height noted.  Motor exam: Normal bulk, and strength for age is noted. Tone is not rigid with absence of cogwheeling. There is overall mild bradykinesia. There is no drift or rebound. There is no tremor.   Romberg is negative. Reflexes are 1+ in the upper extremities and 1+ in the lower extremities. Toes are downgoing bilaterally. Fine motor skills: Finger taps, hand movements, and rapid alternating patting are mildly impaired bilaterally. Foot taps and foot agility are mildly impaired bilaterally.   Cerebellar testing shows no dysmetria or intention tremor on finger to nose testing. Heel to shin is unremarkable. There is no truncal or gait ataxia.   Sensory exam is intact to light touch throughout. It is difficult to assess her motor and sensory exam because of difficulty following instructions. She has to be redirected multiple times.  Gait, station and balance: She stands up from the seated position with moderate difficulty and  needs to push herself up. She does need mild assistance. Her posture is moderately to severely stooped. She walks very slowly and with the help of her 4 pronged cane. Her balance is impaired. Tandem walk is not possible for her.  Assessment and Plan:   In summary, Tnia L Maroney is a very pleasant 81 y.o.-year old female with an underlying medical history of reflux disease, who has had memory loss. Her family also noted some personality changes and irritability. Recent blood work shows hyperlipidemia as well. She has had hypertension as well. Her brain MRI showed moderate atrophy and white matter changes. Most likely she has Alzheimer's dementia and the moderate degree with behavioral changes. I agree with your plan to start her on Aricept. She has not yet started it but I encouraged her to try it. We talked about potential side effects and explained those to her daughter. I have requested that they make a follow-up appointment with you to discuss potentially a small dose of an antidepressant. She appears to be depressed but I would like to make sure that she is able to tolerate Aricept first. I would like to see her back in 3 months, sooner if need be.  I had a long chat with the patient and Dionicio Stall about my findings and the diagnosis of memory loss and dementia, its prognosis and treatment options. Implications of diagnosis explained at length with the patient and caregiver.. We talked about medical treatments and non-pharmacological approaches. We talked about maintaining a healthy lifestyle in general and staying active mentally and physically. I encouraged the patient to eat healthy, exercise daily and keep well hydrated, to keep a scheduled bedtime and wake time routine, to not skip any meals and eat healthy snacks in between meals and to have protein with every meal. I stressed the importance of regular exercise, within of course the patient's own mobility limitations. I encouraged the patient to keep up  with current events.  I encouraged her to eat more and drink more water. I answered all their questions today and the patient and Dionicio Stall were in agreement with the above outlined plan.  Thank you very much for allowing me to participate in the care of this sweet lady! If I can be of any further assistance to you please do not hesitate to call me at [phone].  Sincerely,   Huston Foley, MD, PhD

## 2014-04-01 NOTE — Patient Instructions (Addendum)
I recommend you start Aricept 5 mg once daily at night.  Please follow up with Dr. Renne CriglerPharr in one month and talk about starting a small dose of an antidepressant.  We will do a referral to home health occupational therapy.

## 2014-04-03 DIAGNOSIS — R42 Dizziness and giddiness: Secondary | ICD-10-CM | POA: Diagnosis not present

## 2014-04-03 DIAGNOSIS — M4004 Postural kyphosis, thoracic region: Secondary | ICD-10-CM | POA: Diagnosis not present

## 2014-04-03 DIAGNOSIS — M6281 Muscle weakness (generalized): Secondary | ICD-10-CM | POA: Diagnosis not present

## 2014-04-03 DIAGNOSIS — R269 Unspecified abnormalities of gait and mobility: Secondary | ICD-10-CM | POA: Diagnosis not present

## 2014-04-08 DIAGNOSIS — R413 Other amnesia: Secondary | ICD-10-CM | POA: Diagnosis not present

## 2014-04-10 DIAGNOSIS — M4004 Postural kyphosis, thoracic region: Secondary | ICD-10-CM | POA: Diagnosis not present

## 2014-04-10 DIAGNOSIS — R269 Unspecified abnormalities of gait and mobility: Secondary | ICD-10-CM | POA: Diagnosis not present

## 2014-04-10 DIAGNOSIS — M6281 Muscle weakness (generalized): Secondary | ICD-10-CM | POA: Diagnosis not present

## 2014-04-10 DIAGNOSIS — R42 Dizziness and giddiness: Secondary | ICD-10-CM | POA: Diagnosis not present

## 2014-04-17 DIAGNOSIS — M4004 Postural kyphosis, thoracic region: Secondary | ICD-10-CM | POA: Diagnosis not present

## 2014-04-17 DIAGNOSIS — R269 Unspecified abnormalities of gait and mobility: Secondary | ICD-10-CM | POA: Diagnosis not present

## 2014-04-17 DIAGNOSIS — M6281 Muscle weakness (generalized): Secondary | ICD-10-CM | POA: Diagnosis not present

## 2014-04-17 DIAGNOSIS — R42 Dizziness and giddiness: Secondary | ICD-10-CM | POA: Diagnosis not present

## 2014-04-25 DIAGNOSIS — G309 Alzheimer's disease, unspecified: Secondary | ICD-10-CM | POA: Diagnosis not present

## 2014-04-28 DIAGNOSIS — R42 Dizziness and giddiness: Secondary | ICD-10-CM | POA: Diagnosis not present

## 2014-04-28 DIAGNOSIS — R269 Unspecified abnormalities of gait and mobility: Secondary | ICD-10-CM | POA: Diagnosis not present

## 2014-04-28 DIAGNOSIS — M6281 Muscle weakness (generalized): Secondary | ICD-10-CM | POA: Diagnosis not present

## 2014-04-28 DIAGNOSIS — M4004 Postural kyphosis, thoracic region: Secondary | ICD-10-CM | POA: Diagnosis not present

## 2014-06-05 DIAGNOSIS — R413 Other amnesia: Secondary | ICD-10-CM | POA: Diagnosis not present

## 2014-06-05 DIAGNOSIS — M7742 Metatarsalgia, left foot: Secondary | ICD-10-CM | POA: Diagnosis not present

## 2014-06-05 DIAGNOSIS — M7741 Metatarsalgia, right foot: Secondary | ICD-10-CM | POA: Diagnosis not present

## 2014-06-24 ENCOUNTER — Ambulatory Visit: Payer: Medicare Other | Admitting: Neurology

## 2014-07-07 DIAGNOSIS — K5900 Constipation, unspecified: Secondary | ICD-10-CM | POA: Diagnosis not present

## 2014-07-23 DIAGNOSIS — K5900 Constipation, unspecified: Secondary | ICD-10-CM | POA: Diagnosis not present

## 2015-02-19 DIAGNOSIS — H353120 Nonexudative age-related macular degeneration, left eye, stage unspecified: Secondary | ICD-10-CM | POA: Diagnosis not present

## 2015-02-19 DIAGNOSIS — H35033 Hypertensive retinopathy, bilateral: Secondary | ICD-10-CM | POA: Diagnosis not present

## 2015-02-19 DIAGNOSIS — H40023 Open angle with borderline findings, high risk, bilateral: Secondary | ICD-10-CM | POA: Diagnosis not present

## 2015-02-19 DIAGNOSIS — H353110 Nonexudative age-related macular degeneration, right eye, stage unspecified: Secondary | ICD-10-CM | POA: Diagnosis not present

## 2015-06-02 ENCOUNTER — Ambulatory Visit
Admission: RE | Admit: 2015-06-02 | Discharge: 2015-06-02 | Disposition: A | Discharge status: Home / Self Care | Payer: No Typology Code available for payment source | Source: Ambulatory Visit | Attending: Nurse Practitioner | Admitting: Nurse Practitioner

## 2015-06-02 ENCOUNTER — Other Ambulatory Visit: Payer: Self-pay | Admitting: Nurse Practitioner

## 2015-06-02 DIAGNOSIS — R05 Cough: Secondary | ICD-10-CM

## 2015-06-02 DIAGNOSIS — J111 Influenza due to unidentified influenza virus with other respiratory manifestations: Secondary | ICD-10-CM

## 2015-06-02 DIAGNOSIS — R059 Cough, unspecified: Secondary | ICD-10-CM

## 2015-08-30 ENCOUNTER — Emergency Department (HOSPITAL_COMMUNITY): Payer: Medicare (Managed Care)

## 2015-08-30 ENCOUNTER — Encounter (HOSPITAL_COMMUNITY): Payer: Self-pay | Admitting: *Deleted

## 2015-08-30 ENCOUNTER — Observation Stay (HOSPITAL_COMMUNITY)
Admission: EM | Admit: 2015-08-30 | Discharge: 2015-08-31 | Disposition: A | Discharge status: Home / Self Care | Payer: Medicare (Managed Care) | Attending: Internal Medicine | Admitting: Internal Medicine

## 2015-08-30 DIAGNOSIS — N3000 Acute cystitis without hematuria: Secondary | ICD-10-CM | POA: Diagnosis not present

## 2015-08-30 DIAGNOSIS — F0280 Dementia in other diseases classified elsewhere without behavioral disturbance: Secondary | ICD-10-CM | POA: Diagnosis not present

## 2015-08-30 DIAGNOSIS — R55 Syncope and collapse: Principal | ICD-10-CM | POA: Insufficient documentation

## 2015-08-30 DIAGNOSIS — E860 Dehydration: Secondary | ICD-10-CM | POA: Diagnosis not present

## 2015-08-30 DIAGNOSIS — I129 Hypertensive chronic kidney disease with stage 1 through stage 4 chronic kidney disease, or unspecified chronic kidney disease: Secondary | ICD-10-CM | POA: Diagnosis not present

## 2015-08-30 DIAGNOSIS — Z66 Do not resuscitate: Secondary | ICD-10-CM | POA: Insufficient documentation

## 2015-08-30 DIAGNOSIS — G309 Alzheimer's disease, unspecified: Secondary | ICD-10-CM | POA: Diagnosis not present

## 2015-08-30 DIAGNOSIS — N390 Urinary tract infection, site not specified: Secondary | ICD-10-CM

## 2015-08-30 DIAGNOSIS — B9689 Other specified bacterial agents as the cause of diseases classified elsewhere: Secondary | ICD-10-CM

## 2015-08-30 DIAGNOSIS — F0390 Unspecified dementia without behavioral disturbance: Secondary | ICD-10-CM | POA: Insufficient documentation

## 2015-08-30 DIAGNOSIS — N183 Chronic kidney disease, stage 3 (moderate): Secondary | ICD-10-CM | POA: Insufficient documentation

## 2015-08-30 DIAGNOSIS — Z880 Allergy status to penicillin: Secondary | ICD-10-CM | POA: Diagnosis not present

## 2015-08-30 LAB — CBC WITH DIFFERENTIAL/PLATELET
Basophils Absolute: 0 10*3/uL (ref 0.0–0.1)
Basophils Relative: 0 %
Eosinophils Absolute: 0.1 10*3/uL (ref 0.0–0.7)
Eosinophils Relative: 1 %
HCT: 39.7 % (ref 36.0–46.0)
Hemoglobin: 12.4 g/dL (ref 12.0–15.0)
Lymphocytes Relative: 13 %
Lymphs Abs: 0.9 10*3/uL (ref 0.7–4.0)
MCH: 29.4 pg (ref 26.0–34.0)
MCHC: 31.2 g/dL (ref 30.0–36.0)
MCV: 94.1 fL (ref 78.0–100.0)
Monocytes Absolute: 0.7 10*3/uL (ref 0.1–1.0)
Monocytes Relative: 9 %
Neutro Abs: 5.7 10*3/uL (ref 1.7–7.7)
Neutrophils Relative %: 77 %
Platelets: 200 10*3/uL (ref 150–400)
RBC: 4.22 MIL/uL (ref 3.87–5.11)
RDW: 13.2 % (ref 11.5–15.5)
WBC: 7.4 10*3/uL (ref 4.0–10.5)

## 2015-08-30 LAB — BASIC METABOLIC PANEL
Anion gap: 7 (ref 5–15)
BUN: 25 mg/dL — ABNORMAL HIGH (ref 6–20)
CO2: 24 mmol/L (ref 22–32)
Calcium: 9.5 mg/dL (ref 8.9–10.3)
Chloride: 110 mmol/L (ref 101–111)
Creatinine, Ser: 1.48 mg/dL — ABNORMAL HIGH (ref 0.44–1.00)
GFR calc Af Amer: 34 mL/min — ABNORMAL LOW (ref >60)
GFR calc non Af Amer: 30 mL/min — ABNORMAL LOW (ref >60)
Glucose, Bld: 90 mg/dL (ref 65–99)
Potassium: 4.2 mmol/L (ref 3.5–5.1)
Sodium: 141 mmol/L (ref 135–145)

## 2015-08-30 LAB — URINALYSIS, ROUTINE W REFLEX MICROSCOPIC
Bilirubin Urine: NEGATIVE
Glucose, UA: NEGATIVE mg/dL
Hgb urine dipstick: NEGATIVE
Ketones, ur: NEGATIVE mg/dL
Nitrite: NEGATIVE
Protein, ur: NEGATIVE mg/dL
Specific Gravity, Urine: 1.024 (ref 1.005–1.030)
pH: 6.5 (ref 5.0–8.0)

## 2015-08-30 LAB — CBG MONITORING, ED: Glucose-Capillary: 152 mg/dL — ABNORMAL HIGH (ref 65–99)

## 2015-08-30 LAB — URINE MICROSCOPIC-ADD ON

## 2015-08-30 LAB — D-DIMER, QUANTITATIVE: D-Dimer, Quant: 1.71 ug/mL-FEU — ABNORMAL HIGH (ref 0.00–0.50)

## 2015-08-30 LAB — I-STAT TROPONIN, ED: Troponin i, poc: 0.01 ng/mL (ref 0.00–0.08)

## 2015-08-30 LAB — POC OCCULT BLOOD, ED: Fecal Occult Bld: NEGATIVE

## 2015-08-30 MED ORDER — RISPERIDONE 0.25 MG PO TABS
0.2500 mg | ORAL_TABLET | Freq: Every day | ORAL | Status: DC
  Administered 2015-08-30: 0.25 mg via ORAL
  Filled 2015-08-30 (×2): qty 1

## 2015-08-30 MED ORDER — DICLOFENAC SODIUM 1 % TD GEL
2.0000 g | Freq: Four times a day (QID) | TRANSDERMAL | Status: DC | PRN
  Filled 2015-08-30: qty 100

## 2015-08-30 MED ORDER — CEFTRIAXONE SODIUM 1 G IJ SOLR
1.0000 g | INTRAMUSCULAR | Status: DC
  Administered 2015-08-30: 1 g via INTRAVENOUS
  Filled 2015-08-30 (×2): qty 10

## 2015-08-30 MED ORDER — ADULT MULTIVITAMIN W/MINERALS CH
1.0000 | ORAL_TABLET | Freq: Every day | ORAL | Status: DC
  Administered 2015-08-30 – 2015-08-31 (×2): 1 via ORAL
  Filled 2015-08-30 (×2): qty 1

## 2015-08-30 MED ORDER — ENOXAPARIN SODIUM 30 MG/0.3ML ~~LOC~~ SOLN
30.0000 mg | SUBCUTANEOUS | Status: DC
  Administered 2015-08-30: 30 mg via SUBCUTANEOUS
  Filled 2015-08-30: qty 0.3

## 2015-08-30 MED ORDER — ACETAMINOPHEN 650 MG RE SUPP: 650.0000 mg | Freq: Four times a day (QID) | RECTAL | Status: DC | PRN

## 2015-08-30 MED ORDER — ACETAMINOPHEN 325 MG PO TABS: 650.0000 mg | ORAL_TABLET | Freq: Four times a day (QID) | ORAL | Status: DC | PRN

## 2015-08-30 MED ORDER — IOPAMIDOL (ISOVUE-370) INJECTION 76%
INTRAVENOUS | Status: AC
  Administered 2015-08-30: 45 mL
  Filled 2015-08-30: qty 100

## 2015-08-30 MED ORDER — SODIUM CHLORIDE 0.9% FLUSH
3.0000 mL | Freq: Two times a day (BID) | INTRAVENOUS | Status: DC
  Administered 2015-08-30 – 2015-08-31 (×2): 3 mL via INTRAVENOUS

## 2015-08-30 MED ORDER — SODIUM CHLORIDE 0.9 % IV SOLN
INTRAVENOUS | Status: AC
  Administered 2015-08-30 (×2): via INTRAVENOUS

## 2015-08-30 NOTE — ED Provider Notes (Addendum)
CSN: [id number]     Arrival date & time 08/30/15  1214 History   First MD Initiated Contact with Patient 08/30/15 1308     Chief Complaint  Patient presents with  . Loss of Consciousness   Level V caveat dementia history is obtained from patient and from patient's daughter who accompanies her  (Consider location/radiation/quality/duration/timing/severity/associated sxs/prior Treatment) HPI Patient was sitting in a chair 11:45 AM today when she suffered loss of consciousness lasting for a prostate 1 minute followed by period of being less responsive for approximately 30 minutes. Patient reports "I lost my breath" she states she is breathing normally now and is asymptomatic. No treatment prior to coming here. Brought by EMS. Past Medical History  Diagnosis Date  . Memory loss   . Tinea pedis   . Weight loss   . Gait abnormality   . Dizziness   . Hypertension   . Depression   . Anxiety    Past Surgical History  Procedure Laterality Date  . Cataract extraction    . Tubal ligation     Family History  Problem Relation Age of Onset  . Heart attack Father    Social History  Substance Use Topics  . Smoking status: Never Smoker   . Smokeless tobacco: Never Used  . Alcohol Use: No   OB History    No data available     Review of Systems  Unable to perform ROS: Dementia  Constitutional: Positive for appetite change.       Diminished appetite for several weeks  Respiratory: Positive for shortness of breath.   Cardiovascular:       Syncope      Allergies  Sulfa antibiotics and Penicillins  Home Medications   Prior to Admission medications   Medication Sig Start Date End Date Taking? Authorizing Provider  acetaminophen (TYLENOL) 650 MG CR tablet Take 650 mg by mouth. 03/12/12   Historical Provider, MD  calcium carbonate (OS-CAL) 600 MG TABS tablet Take 600 mg by mouth.    Historical Provider, MD  diclofenac sodium (VOLTAREN) 1 % GEL Place 2 g onto the skin. 08/30/11    Historical Provider, MD  feeding supplement, ENSURE, (ENSURE) PUDG Take 1 Container by mouth. 09/06/11   Historical Provider, MD  Multiple Vitamin (MULTIVITAMIN) capsule Take 1 capsule by mouth daily.    Historical Provider, MD  Nutritional Supplements (COMPLETE NUTRITION PLUS) LIQD Take 1 Can by mouth. 09/06/11   Historical Provider, MD  polyethylene glycol (MIRALAX / GLYCOLAX) packet Take 17 g by mouth.    Historical Provider, MD   BP 130/66 mmHg  Pulse 95  Temp(Src) 97.8 F (36.6 C) (Oral)  Resp 31  SpO2 99% Physical Exam  Constitutional: She appears well-developed and well-nourished.  HENT:  Head: Normocephalic and atraumatic.  Eyes: Conjunctivae are normal. Pupils are equal, round, and reactive to light.  Neck: Neck supple. No tracheal deviation present. No thyromegaly present.  Cardiovascular: Normal rate and regular rhythm.   No murmur heard. Pulmonary/Chest: Effort normal and breath sounds normal.  Abdominal: Soft. Bowel sounds are normal. She exhibits no distension. There is no tenderness.  Genitourinary: Guaiac negative stool.  Rectum normal tone and brown stool guaiac-negative  Musculoskeletal: Normal range of motion. She exhibits no edema or tenderness.  Neurological: She is alert. No cranial nerve deficit. Coordination normal.  Follow simple commands moves all extremities well  Skin: Skin is warm and dry. No rash noted.  Psychiatric: She has a normal mood and affect.  Nursing  note and vitals reviewed.   ED Course  Procedures (including critical care time) Labs Review Labs Reviewed  CBG MONITORING, ED - Abnormal; Notable for the following:    Glucose-Capillary 152 (*)    All other components within normal limits    Imaging Review No results found. I have personally reviewed and evaluated these images and lab results as part of my medical decision-making.   EKG Interpretation   Date/Time:  Sunday August 30 2015 12:32:04 EDT Ventricular Rate:  91 PR Interval:     QRS Duration: 83 QT Interval:  362 QTC Calculation: 446 R Axis:   -4 Text Interpretation:  Sinus rhythm Atrial premature complex No old tracing  to compare Confirmed by Sally-Ann Cutbirth  MD, Jatavius Ellenwood [id number]) on 08/30/2015 1:34:28 PM     4:20 PM patient resting comfortably. Results for orders placed or performed during the hospital encounter of 08/30/15  D-dimer, quantitative (not at [facility])  Result Value Ref Range   D-Dimer, Quant 1.71 (H) 0.00 - 0.50 ug/mL-FEU  Basic metabolic panel  Result Value Ref Range   Sodium 141 135 - 145 mmol/L   Potassium 4.2 3.5 - 5.1 mmol/L   Chloride 110 101 - 111 mmol/L   CO2 24 22 - 32 mmol/L   Glucose, Bld 90 65 - 99 mg/dL   BUN 25 (H) 6 - 20 mg/dL   Creatinine, Ser 6.041.48 (H) 0.44 - 1.00 mg/dL   Calcium 9.5 8.9 - 54.010.3 mg/dL   GFR calc non Af Amer 30 (L) >60 mL/min   GFR calc Af Amer 34 (L) >60 mL/min   Anion gap 7 5 - 15  CBC with Differential/Platelet  Result Value Ref Range   WBC 7.4 4.0 - 10.5 K/uL   RBC 4.22 3.87 - 5.11 MIL/uL   Hemoglobin 12.4 12.0 - 15.0 g/dL   HCT 98.139.7 19.136.0 - 47.846.0 %   MCV 94.1 78.0 - 100.0 fL   MCH 29.4 26.0 - 34.0 pg   MCHC 31.2 30.0 - 36.0 g/dL   RDW 29.513.2 62.111.5 - 30.815.5 %   Platelets 200 150 - 400 K/uL   Neutrophils Relative % 77 %   Neutro Abs 5.7 1.7 - 7.7 K/uL   Lymphocytes Relative 13 %   Lymphs Abs 0.9 0.7 - 4.0 K/uL   Monocytes Relative 9 %   Monocytes Absolute 0.7 0.1 - 1.0 K/uL   Eosinophils Relative 1 %   Eosinophils Absolute 0.1 0.0 - 0.7 K/uL   Basophils Relative 0 %   Basophils Absolute 0.0 0.0 - 0.1 K/uL  Urinalysis, Routine w reflex microscopic (not at [facility])  Result Value Ref Range   Color, Urine YELLOW YELLOW   APPearance CLOUDY (A) CLEAR   Specific Gravity, Urine 1.024 1.005 - 1.030   pH 6.5 5.0 - 8.0   Glucose, UA NEGATIVE NEGATIVE mg/dL   Hgb urine dipstick NEGATIVE NEGATIVE   Bilirubin Urine NEGATIVE NEGATIVE   Ketones, ur NEGATIVE NEGATIVE mg/dL   Protein, ur NEGATIVE NEGATIVE mg/dL   Nitrite  NEGATIVE NEGATIVE   Leukocytes, UA MODERATE (A) NEGATIVE  Urine microscopic-add on  Result Value Ref Range   Squamous Epithelial / LPF 6-30 (A) NONE SEEN   WBC, UA 6-30 0 - 5 WBC/hpf   RBC / HPF 0-5 0 - 5 RBC/hpf   Bacteria, UA FEW (A) NONE SEEN  CBG monitoring, ED  Result Value Ref Range   Glucose-Capillary 152 (H) 65 - 99 mg/dL   Comment 1 Notify RN    Comment  2 Document in Chart   POC occult blood, ED Provider will collect  Result Value Ref Range   Fecal Occult Bld NEGATIVE NEGATIVE  I-stat troponin, ED  Result Value Ref Range   Troponin i, poc 0.01 0.00 - 0.08 ng/mL   Comment 3           Ct Angio Chest Pe W Or Wo Contrast  08/30/2015  CLINICAL DATA:  SOB.  Elevated D-dimer EXAM: CT ANGIOGRAPHY CHEST WITH CONTRAST TECHNIQUE: Multidetector CT imaging of the chest was performed using the standard protocol during bolus administration of intravenous contrast. Multiplanar CT image reconstructions and MIPs were obtained to evaluate the vascular anatomy. CONTRAST:  45 mL Isovue 370 IV COMPARISON:  Chest x-ray 08/30/2015 FINDINGS: Negative for pulmonary embolism. Negative for aortic aneurysm or dissection. Atherosclerotic calcification in the aortic arch. Coronary artery calcification Aberrant right subclavian with retroesophageal course. This is a normal variant. Cardiac enlargement without pericardial effusion Negative for infiltrate.  No pleural effusion. Negative for mass or adenopathy Moderately large hiatal hernia.  Atrophic right kidney Moderate compression fracture T7 which appears chronic and unchanged from prior studies. Review of the MIP images confirms the above findings. IMPRESSION: Negative for pulmonary embolism The lungs are clear Hiatal hernia Aberrant right subclavian artery Atrophic right kidney Electronically Signed   By: Marlan Palau M.D.   On: 08/30/2015 15:59   Dg Chest Port 1 View  08/30/2015  CLINICAL DATA:  Syncope EXAM: PORTABLE CHEST 1 VIEW COMPARISON:  06/02/2015  FINDINGS: 1358 hours. Low volumes without edema or focal airspace consolidation. Patient rotated to the left, accentuating cardiopericardial silhouette. Small hiatal hernia again noted. The visualized bony structures of the thorax are intact. Telemetry leads overlie the chest. IMPRESSION: No acute cardiopulmonary findings. Small hiatal hernia Electronically Signed   By: Kennith Center M.D.   On: 08/30/2015 14:05   Chest x-ray viewed by me MDM  Internal medicine resident physician consulted. Will see patient on floor. Plan 23 hour observation Telemetry. Urine sent for culture Final diagnoses:  None  Diagnosis #1 syncope #2 renal insufficiency      Doug Sou, MD 08/30/15 1623  Doug Sou, MD 08/30/15 1624

## 2015-08-30 NOTE — Progress Notes (Signed)
Ceftriaxone for UTI per pharmacy ordered.  Ceftriaxone does not need renal adjustment.  [employer] policy allows pharmacy to change the ordered dose based on indication without contacting the physician, therefore a consult is not needed.   Plan: -ceftriaxone 1g IV q24h -pharmacy to sign off as no adjustment needed.  Zayon Trulson D. Kathalene Sporer, PharmD, BCPS Clinical Pharmacist Pager: [phone] 08/30/2015 6:23 PM

## 2015-08-30 NOTE — ED Notes (Signed)
Patient transported to CT 

## 2015-08-30 NOTE — ED Notes (Signed)
Pt in via [facility] from home, per family they were praying & when they finished noted the pts teeth were hanging out of her mouth, pt hx of dementia, pt alert to self & place, pt disoriented to situation, pt denies hitting head, pt MAE, pt A&O x4, pt self reports SOB with episode, unknown duration of syncopal episode

## 2015-08-30 NOTE — Progress Notes (Signed)
Patient arrived in the unit accompanied by NT. Patient alert but confused at times. Able to verbalizes her needs.

## 2015-08-30 NOTE — H&P (Signed)
Date: 08/30/2015               Patient Name:  Rexene AlbertsKumud L Shuttleworth MRN: [id number]  DOB: January 21, 1923 Age / Sex: 82 y.o., female   PCP: Merri BrunetteWalter Pharr, MD         Medical Service: Internal Medicine Teaching Service         Attending Physician: Dr. Burns SpainElizabeth A Butcher, MD    First Contact: Dr. Reymundo Pollarolyn Halen Antenucci Pager: [phone]  Second Contact: Dr. Griffin BasilJennifer Krall  Pager: [phone]       After Hours (After 5p/  First Contact Pager: [phone]  weekends / holidays): Second Contact Pager: [phone]   Chief Complaint: LOC  History of Present Illness: Ms. Loney LohRathore is a 82 yo F with newly diagnosed dementia who presents to the ED after experiencing a pre syncopal episode this afternoon. Per daughter, the patient was sitting when she bent over and suddenly "spaced out". Her dentures fell out of her mouth, she became unresponsive to questions, and was staring straight ahead. After a few seconds she became more alert however seemed tired and "not all there". Patient returned to baseline 15-20 minutes after the episode. Daughter denies falling or LOC. History provided entirely by the daughter.   Meds: No current facility-administered medications for this encounter.   Current Outpatient Prescriptions  Medication Sig Dispense Refill  . acetaminophen (TYLENOL) 650 MG CR tablet Take 650 mg by mouth every 8 (eight) hours as needed for pain.     Marland Kitchen. diclofenac sodium (VOLTAREN) 1 % GEL Place 2 g onto the skin as needed.     . ENSURE (ENSURE) Take 237 mLs by mouth 2 (two) times daily as needed.    . Multiple Vitamin (MULTIVITAMIN) capsule Take 1 capsule by mouth daily.    . risperiDONE (RISPERDAL) 0.25 MG tablet Take 0.25 mg by mouth at bedtime.      Allergies: Allergies as of 08/30/2015 - Review Complete 08/30/2015  Allergen Reaction Noted  . Sulfa antibiotics Itching and Rash 03/24/2014  . Penicillins Nausea Only and Other (See Comments) 03/24/2014   Past Medical History  Diagnosis Date  . Memory loss     . Tinea pedis   . Weight loss   . Gait abnormality   . Dizziness   . Hypertension   . Depression   . Anxiety     Family History: Unable to obtain due to dementia.   Social History: Lives at home with daughter and son in [place].   Review of Systems: A complete ROS was negative except as per HPI.   Physical Exam: Blood pressure 171/76, pulse 81, temperature 98.3 F (36.8 C), temperature source Oral, resp. rate 26, SpO2 100 %.   Constitutional: Thin elderly lady in NAD HEENT: Atraumatic, normocephalic, anicteric sclera  Cardiovascular: RRR, no murmurs rubs or gallops Pulmonary/Chest: CTAB Abdominal: Abdomen soft, non tender, non distended  Extremities: distal pulses intact, no edema Neurological: CN II-XII grossly entact Skin: decreased skin turgor   EKG: NSR   CXR: No acute cardiopulmonary disease  Assessment & Plan by Problem:  Pre-Syncope: I-stat troponin negative, EKG NSR, and CXR unremarkable. Unlikely to be ACS. Positive D-dimer but negative CTPE study ruling out PE, other respiratory etiology unlikely. Spoke to PCP on phone who confirmed similar episode in office. Patient was dehydrated and recovered with po rehydration. Likely multifactorial due to dementia and dehydration.  - Continue to monitor on tele - Check orthostatics - Start fluids at 50 cc/hr  Complicated UTI: in  the setting of CKD stage 3 (GFR 30-40, baseline Cr 1.4). UA with cloudy appearance, moderate leukocytes, and few bacteria. Pt has been complaining of dysuria at home.  - Start ceftriaxone 1g IV QD - f/u urine culture   DNR/DNI  Dispo: Admit patient to Observation with expected length of stay less than 2 midnights.  Signed: Reymundo Pollarolyn Jaymz Traywick, MD 08/30/2015, 5:57 PM  Pager: [phone]

## 2015-08-31 DIAGNOSIS — B9689 Other specified bacterial agents as the cause of diseases classified elsewhere: Secondary | ICD-10-CM | POA: Diagnosis not present

## 2015-08-31 DIAGNOSIS — R55 Syncope and collapse: Secondary | ICD-10-CM | POA: Diagnosis not present

## 2015-08-31 DIAGNOSIS — N183 Chronic kidney disease, stage 3 (moderate): Secondary | ICD-10-CM | POA: Diagnosis not present

## 2015-08-31 DIAGNOSIS — N390 Urinary tract infection, site not specified: Secondary | ICD-10-CM | POA: Diagnosis not present

## 2015-08-31 LAB — BASIC METABOLIC PANEL
Anion gap: 6 (ref 5–15)
BUN: 22 mg/dL — ABNORMAL HIGH (ref 6–20)
CHLORIDE: 107 mmol/L (ref 101–111)
CO2: 26 mmol/L (ref 22–32)
Calcium: 9.4 mg/dL (ref 8.9–10.3)
Creatinine, Ser: 1.32 mg/dL — ABNORMAL HIGH (ref 0.44–1.00)
GFR calc non Af Amer: 34 mL/min — ABNORMAL LOW (ref >60)
GFR, EST AFRICAN AMERICAN: 40 mL/min — AB (ref >60)
Glucose, Bld: 94 mg/dL (ref 65–99)
Potassium: 4 mmol/L (ref 3.5–5.1)
SODIUM: 139 mmol/L (ref 135–145)

## 2015-08-31 LAB — CBC
HEMATOCRIT: 35.2 % — AB (ref 36.0–46.0)
HEMOGLOBIN: 11.3 g/dL — AB (ref 12.0–15.0)
MCH: 29.9 pg (ref 26.0–34.0)
MCHC: 32.1 g/dL (ref 30.0–36.0)
MCV: 93.1 fL (ref 78.0–100.0)
Platelets: 188 10*3/uL (ref 150–400)
RBC: 3.78 MIL/uL — AB (ref 3.87–5.11)
RDW: 13.1 % (ref 11.5–15.5)
WBC: 7.6 10*3/uL (ref 4.0–10.5)

## 2015-08-31 MED ORDER — CIPROFLOXACIN HCL 250 MG PO TABS
250.0000 mg | ORAL_TABLET | Freq: Every day | ORAL | Status: DC
  Administered 2015-08-31: 250 mg via ORAL
  Filled 2015-08-31: qty 1

## 2015-08-31 MED ORDER — CIPROFLOXACIN HCL 250 MG PO TABS: 250.0000 mg | ORAL_TABLET | Freq: Every day | ORAL | Status: DC

## 2015-08-31 NOTE — Clinical Social Work Note (Signed)
CSW facilitated patient discharge including contacting patient family and facility to confirm patient discharge plans. Clinical information faxed to facility and family agreeable with plan. CSW arranged ambulance transport via [facility] to [facility]. RN to call report prior to discharge ([phone]).  CSW will sign off for now as social work intervention is no longer needed. Please consult us again if new needs arise.  Samantha Olivera, CSW [phone]   

## 2015-08-31 NOTE — Progress Notes (Signed)
Received call from [facility] Marylu LundJanet; [facility] and instructed to have the Soc Worker call her; Huntley DecSara SW updated, she will f/u with [facility] as requested; Alexis GoodellB Jarris Kortz RN,MHA,BSN [phone]

## 2015-08-31 NOTE — Progress Notes (Signed)
Patient is active with [facility] - A PROGRAM OF ALL-INCLUSIVE CARE FOR THE ELDERLY Supporting enhanced living and independence in your own home. [phone]);  [facility] called, message left with the Soc Worker of patient's admission. Abelino DerrickB Toyia Jelinek [facility] HospitalRN,MHA,BSN [phone]

## 2015-08-31 NOTE — Progress Notes (Signed)
  Date: 08/31/2015  Patient name: Dana Tate  Medical record number: [id number]  Date of birth: 1922-04-12   I have seen and evaluated Dana Tate and discussed their care with the Residency Team. Dana Tate's CC was brief episode of unresponsive. Hx obtained from Dr Antony ContrasGuilloud and the pt's daughter at the bedside as Dana Tate has dementia. Pt has dementia and is cared for by her daughter and [facility]. The daughter is overwhelmed by the progression of the dementia, her mother's sxs, lack of understanding, and feeling like her mother is deliberately acting out to get attention. The daughter states that sometimes the mother leans forwards and places her head down on arms or table. She did this while sitting on day of admission but this episode was more severe in that she was not reactive, was drooling, her dentures came out, and it took 20 min to return to baseline. [facility] told admitting team that she had a similar episode in their office and that thy attributed it to vol contraction.  Dana Tate saw neuro in Feb 2016 for memory loss, noted at that time to have been present for yrs and worsening over the prior yr (2015). The likely dx at that time was Alzheimer's disease with possible depression. She was on Aricept at that time. The daughter notes that the pt is no longer eating or drinking much and has to be cajoled to drink an Ensure. She is not incontinent of urine or stool. The daughter states that the behaviour has bc more childlike and demanding. Her urine has bc more foul smelling in past week or so. All sxs worse for 1-2 weeks.  PMHx, Fam Hx, and/or Soc Hx : Lives with daughter who is primary care giver and her husband. No further details obtained 2/2 pt's dementia.  Filed Vitals:   08/31/15 0924 08/31/15 1050  BP: 150/65 174/82  Pulse: 79 66  Temp:  98.4 F (36.9 C)  Resp:  20   Frail woman who moves freely in bed.  HRRR systolic murmur LCTAB  ABD + BS, soft Ext no edema Neuro talking,  moving all 4  Assessment and Plan: I have seen and evaluated the patient as outlined above. I agree with the formulated Assessment and Plan as detailed in the residents' note, with the following changes:   1. Alzheimer's Disease - The daughter is clearly overwhelmed by her mother's sxs and the amt of care that is required and her daughter feels that she is inadequate to  care for her mother and is exhausted and frustrated. The daughter wants her to stay in the hospital but we explained that it is not in the pt's best interest to remain here. Dr Antony ContrasGuilloud will discuss with [facility] whether the pt can be transferred to respite care.  2. Pre-syncope - Pt got IVF overnight. We explained that part of the dementia process is lack of appetite and desire to eat and drink. We stressed that we cannot reverse this and that her mom may simply not feel the sxs of hunger and thirst.   3. UTi - change to PO ABX to complete course. F/U urine cxs.  Possible D/C today after talking with [facility].  Burns SpainElizabeth A Yekaterina Escutia, MD 7/3/201711:56 AM

## 2015-08-31 NOTE — Progress Notes (Signed)
   Subjective: Patient doing well this morning with no complaints. Daughter at bedside voiced concern about taking care of patient at home alone and is requesting assistance. Spoke with the patient's PCP Dr. Jimmye Norman who is coordinating respite care for patient's discharge hopefully today.   Objective: Vital signs in last 24 hours: Filed Vitals:   08/31/15 0549 08/31/15 0801 08/31/15 0924 08/31/15 1050  BP: 178/97 195/86 150/65 174/82  Pulse: 84 73 79 66  Temp: 98.5 F (36.9 C) 97.8 F (36.6 C)  98.4 F (36.9 C)  TempSrc: Oral Oral  Oral  Resp: '19 18  20  '$ Height:      Weight: 103 lb 11.2 oz (47.038 kg)     SpO2: 96% 99%  100%   Labs:  Ref. Range 08/31/2015 00:34  Sodium Latest Ref Range: 135-145 mmol/L 139  Potassium Latest Ref Range: 3.5-5.1 mmol/L 4.0  Chloride Latest Ref Range: 101-111 mmol/L 107  CO2 Latest Ref Range: 22-32 mmol/L 26  BUN Latest Ref Range: 6-20 mg/dL 22 (H)  Creatinine Latest Ref Range: 0.44-1.00 mg/dL 1.32 (H)  Calcium Latest Ref Range: 8.9-10.3 mg/dL 9.4  EGFR (Non-African Amer.) Latest Ref Range: >60 mL/min 34 (L)  EGFR (African American) Latest Ref Range: >60 mL/min 40 (L)  Glucose Latest Ref Range: 65-99 mg/dL 94  Anion gap Latest Ref Range: 5-15  6  WBC Latest Ref Range: 4.0-10.5 K/uL 7.6  RBC Latest Ref Range: 3.87-5.11 MIL/uL 3.78 (L)  Hemoglobin Latest Ref Range: 12.0-15.0 g/dL 11.3 (L)  HCT Latest Ref Range: 36.0-46.0 % 35.2 (L)  MCV Latest Ref Range: 78.0-100.0 fL 93.1  MCH Latest Ref Range: 26.0-34.0 pg 29.9  MCHC Latest Ref Range: 30.0-36.0 g/dL 32.1  RDW Latest Ref Range: 11.5-15.5 % 13.1  Platelets Latest Ref Range: 150-400 K/uL 188   Physical Exam Constitutional: Thin elderly lady in NAD HEENT: Atraumatic, normocephalic, anicteric sclera  Cardiovascular: RRR, no murmurs rubs or gallops Pulmonary/Chest: CTAB Abdominal: Abdomen soft, non tender, non distended  Extremities: distal pulses intact, no edema Neurological: CN II-XII grossly  entact Skin: skin turgor improved  Assessment/Plan:  Pre-Syncope: I-stat troponin negative, EKG NSR, and CXR unremarkable. Unlikely to be ACS. Positive D-dimer but negative CTPE study ruling out PE, other respiratory etiology unlikely. Spoke to PCP on phone who confirmed similar episode in office. Patient was dehydrated and recovered with po rehydration. Pt on tele with no events. Doing better this morning after fluids overnight. Symptoms likely multifactorial due to dementia and dehydration. Per daughter, patient does eat or drink much at home.  - Encourage further PO hydration  - Plan to discharge today pending respite placement   Complicated UTI: in the setting of CKD stage 3 (GFR 30-40, baseline Cr 1.4). UA with cloudy appearance, moderate leukocytes, and few bacteria. Pt has been complaining of dysuria at home.  - Discontinue ceftriaxone 1g IV QD - Start Cipro '250mg'$  po QD x7 days per pharmacy recs in the setting of reduced renal clearance - f/u urine culture   DNR/DNI  Dispo: Anticipated discharge in approximately 1-2 day(s).     Velna Ochs, MD 08/31/2015, 11:12 AM Pager: [phone]

## 2015-08-31 NOTE — [place] FL2 (Signed)
  [place] MEDICAID FL2 LEVEL OF CARE SCREENING TOOL     IDENTIFICATION  Patient Name: Dana Tate Birthdate: 07-19-1922 Sex: female Admission Date (Current Location): 08/30/2015  [facility]   Facility and Address:  [facility] [place], [address], [place], [place] [address]      Provider Number: [id number]  Attending Physician Name and Address:  Burns SpainElizabeth A Butcher, MD  Relative Name and Phone Number:       Current Level of Care: Hospital Recommended Level of Care: Skilled Nursing Facility, Other (Comment) (Respite Care) Prior Approval Number:    Date Approved/Denied:   PASRR Number: [id number]  Discharge Plan: Other (Comment) (SNF for respite care)    Current Diagnoses: Patient Active Problem List   Diagnosis Date Noted  . Pre-syncope 08/30/2015  . Dementia   . Acute cystitis without hematuria     Orientation RESPIRATION BLADDER Height & Weight        Normal Continent Weight: 103 lb 11.2 oz (47.038 kg) (bed scale) Height:  4\' 8"  (142.2 cm)  BEHAVIORAL SYMPTOMS/MOOD NEUROLOGICAL BOWEL NUTRITION STATUS   (None)  (Dementia) Continent Diet (Vegetarian)  AMBULATORY STATUS COMMUNICATION OF NEEDS Skin   Limited Assist Verbally Normal                       Personal Care Assistance Level of Assistance  Bathing, Feeding, Dressing Bathing Assistance: Limited assistance Feeding assistance: Independent Dressing Assistance: Limited assistance     Functional Limitations Info  Sight, Hearing, Speech Sight Info: Adequate Hearing Info: Adequate Speech Info: Adequate    SPECIAL CARE FACTORS FREQUENCY                       Contractures Contractures Info: Not present    Additional Factors Info  Code Status, Allergies Code Status Info: DNR Allergies Info: Sulfa Antibiotics, Penicillins           Current Medications (08/31/2015):  This is the current hospital active medication list Current  Facility-Administered Medications  Medication Dose Route Frequency Provider Last Rate Last Dose  . acetaminophen (TYLENOL) tablet 650 mg  650 mg Oral Q6H PRN Lora PaulaJennifer T Krall, MD       Or  . acetaminophen (TYLENOL) suppository 650 mg  650 mg Rectal Q6H PRN Lora PaulaJennifer T Krall, MD      . ciprofloxacin (CIPRO) tablet 250 mg  250 mg Oral Daily Reymundo Pollarolyn Guilloud, MD   250 mg at 08/31/15 1236  . diclofenac sodium (VOLTAREN) 1 % transdermal gel 2 g  2 g Topical QID PRN Lora PaulaJennifer T Krall, MD      . enoxaparin (LOVENOX) injection 30 mg  30 mg Subcutaneous Q24H Lora PaulaJennifer T Krall, MD   30 mg at 08/30/15 1946  . multivitamin with minerals tablet 1 tablet  1 tablet Oral Daily Lora PaulaJennifer T Krall, MD   1 tablet at 08/31/15 0920  . risperiDONE (RISPERDAL) tablet 0.25 mg  0.25 mg Oral QHS Lora PaulaJennifer T Krall, MD   0.25 mg at 08/30/15 2210  . sodium chloride flush (NS) 0.9 % injection 3 mL  3 mL Intravenous Q12H Lora PaulaJennifer T Krall, MD   3 mL at 08/31/15 1000     Discharge Medications: Please see discharge summary for a list of discharge medications.  Relevant Imaging Results:  Relevant Lab Results:   Additional Information SS#: [id number]  Margarito LinerSarah C Nickey Kloepfer, LCSW

## 2015-08-31 NOTE — Progress Notes (Signed)
MEDICATION RELATED NOTE   Pharmacy Re:  Cipro Renal Dose adjustment  Allergies  Allergen Reactions  . Sulfa Antibiotics Itching and Rash  . Penicillins Nausea Only and Other (See Comments)   Patient Measurements: Height: 4\' 8"  (142.2 cm) Weight: 103 lb 11.2 oz (47.038 kg) (bed scale) IBW/kg (Calculated) : 36.3  Labs:  Recent Labs  08/30/15 1354 08/31/15 0034  WBC 7.4 7.6  HGB 12.4 11.3*  HCT 39.7 35.2*  PLT 200 188  CREATININE 1.48* 1.32*   Estimated Creatinine Clearance: 17.8 mL/min (by C-G formula based on Cr of 1.32).   Medications:  Anti-infectives    Start     Dose/Rate Route Frequency Ordered Stop   08/31/15 1100  ciprofloxacin (CIPRO) tablet 250 mg     250 mg Oral Daily 08/31/15 1037 09/07/15 0959   08/30/15 2000  cefTRIAXone (ROCEPHIN) 1 g in dextrose 5 % 50 mL IVPB  Status:  Discontinued     1 g 100 mL/hr over 30 Minutes Intravenous Every 24 hours 08/30/15 1824 08/31/15 1037      Assessment: 82 yo female with UTI - ordered Cipro 250mg  bid.  She has reduced renal function and lean body mass.  Expect clearance is even less that predicted at 3618ml/min.    Plan:  Decrease dose of Cipro 250mg  to daily x 7 days.  Nadara MustardNita Suni Jarnagin, PharmD., MS Clinical Pharmacist Pager:  [phone] Thank you for allowing pharmacy to be part of this patients care team. 08/31/2015,10:46 AM

## 2015-08-31 NOTE — Discharge Summary (Signed)
Name: Dana AlbertsKumud L Alguire MRN: [id number] DOB: January 26, 1923 [age] y.o. PCP: Merri BrunetteWalter Pharr, MD  Date of Admission: 08/30/2015 12:14 PM Date of Discharge: 08/31/2015 Attending Physician: Burns SpainElizabeth A Butcher, MD  Discharge Diagnosis: Active Problems:   Pre-syncope   Dementia   Acute cystitis without hematuria   Discharge Medications:   Medication List    TAKE these medications        acetaminophen 650 MG CR tablet  Commonly known as:  TYLENOL  Take 650 mg by mouth every 8 (eight) hours as needed for pain.     ciprofloxacin 250 MG tablet  Commonly known as:  CIPRO  Take 1 tablet (250 mg total) by mouth daily.  Start taking on:  09/01/2015     diclofenac sodium 1 % Gel  Commonly known as:  VOLTAREN  Place 2 g onto the skin as needed.     ENSURE  Take 237 mLs by mouth 2 (two) times daily as needed.     multivitamin capsule  Take 1 capsule by mouth daily.     risperiDONE 0.25 MG tablet  Commonly known as:  RISPERDAL  Take 0.25 mg by mouth at bedtime.        Disposition and follow-up:   [name] [facility] St[facility].  At the hospital follow up visit please address:  1.  Follow up with patient and family to ensure no further pre syncopal episodes. Encourage po intake. Complete antibiotic course for UTI. If episodes continue to occur, consider seizure in differential and work-up.  2.  Labs / imaging needed at time of follow-up: follow up BMP to check kidney function, baseline cr. 1.4  3.  Pending labs/ test needing follow-up: Urine culture  Follow-up Appointments: Follow-up Information    Schedule an appointment as soon as possible for a visit with Miguel AschoffJulie Anne Williams, MD.   Specialty:  Internal Medicine   Why:  Hospital Follow Up   Contact information:   [address] [place] [address] [phone]       Hospital Course by problem list: Active Problems:   Pre-syncope   Dementia   Acute cystitis without  hematuria   1. Pre-Syncope: She has a history of dementia and presented after a brief episode of unresponsiveness. She appeared to remain conscious during the episode. It took about 20 minutes for her to return to baseline. Troponin and EKG unremarkable making ACS unlikely. No PE on CT angiogram of the chest. She had decreased skin turgor and per her daughter had decreased PO intake. She was admitted for observation. She was given normal saline overnight. No events noted on telemetry. She returned to baseline mental status by discharge with no further episodes of her presenting complaint. She will discharge to respite care services with instructions to [facility]  2. Complicated UTI: in the setting of CKD stage 3 (GFR 30-40, baseline Cr 1.3 to 1.4). UA with cloudy appearance, moderate leukocytes, and few bacteria. Pt has been complaining of dysuria at home. She was started on ceftriaxone 1g IV daily. She was transitioned to ciprofloxacin 250mg  daily (adjusted for decreased creatinine clearance) to complete 7 days of antibiotics.   Discharge Vitals:   BP 174/82 mmHg  Pulse 66  Temp(Src) 98.4 F (36.9 C) (Oral)  Resp 20  Ht 4\' 8"  (1.422 m)  Wt 103 lb 11.2 oz (47.038 kg)  BMI 23.26 kg/m2  SpO2 100%  Pertinent Labs,  Studies, and Procedures:   08/30/15 Troponin: 0.01  08/30/15 D-Dimer: 1.71  08/30/15 UA:   Ref Range 1d ago    Color, Urine YELLOW  YELLOW   APPearance CLEAR  CLOUDY (A)   Specific Gravity, Urine 1.005 - 1.030  1.024   pH 5.0 - 8.0  6.5   Glucose, UA NEGATIVE mg/dL NEGATIVE   Hgb urine dipstick NEGATIVE  NEGATIVE   Bilirubin Urine NEGATIVE  NEGATIVE   Ketones, ur NEGATIVE mg/dL NEGATIVE   Protein, ur NEGATIVE mg/dL NEGATIVE   Nitrite NEGATIVE  NEGATIVE   Leukocytes, UA NEGATIVE  MODERATE (A)        Squamous Epithelial / LPF NONE SEEN  6-30 (A)   WBC, UA 0 - 5 WBC/hpf 6-30   RBC / HPF 0 - 5 RBC/hpf 0-5   Bacteria, UA  NONE SEEN  FEW (A)         08/30/2015 CT Angio Chest PE  IMPRESSION: Negative for pulmonary embolism  The lungs are clear  Hiatal hernia  Aberrant right subclavian artery  Atrophic right kidney  Discharge Instructions: Discharge Instructions    Call MD for:  difficulty breathing, headache or visual disturbances    Complete by:  As directed      Call MD for:  persistant dizziness or light-headedness    Complete by:  As directed      Call MD for:  persistant nausea and vomiting    Complete by:  As directed      Call MD for:  severe uncontrolled pain    Complete by:  As directed      Call MD for:  temperature >100.4    Complete by:  As directed      Discharge instructions    Complete by:  As directed   Take your antibiotic, ciprofloxacin, once a day until you complete it. Continue to take your medications as prescribed. Try to stay hydrated. Follow up with your primary care in the next week or two.           Signed: Reymundo Pollarolyn Emiliano Welshans, MD 08/31/2015, 1:49 PM   Pager: [phone]

## 2015-09-01 LAB — URINE CULTURE

## 2015-11-13 ENCOUNTER — Inpatient Hospital Stay (HOSPITAL_COMMUNITY)
Admission: AD | Admit: 2015-11-13 | Discharge: 2015-11-13 | Disposition: A | Discharge status: Home / Self Care | Payer: Medicare (Managed Care) | Source: Ambulatory Visit | Attending: Obstetrics and Gynecology | Admitting: Obstetrics and Gynecology

## 2015-11-13 ENCOUNTER — Inpatient Hospital Stay (HOSPITAL_COMMUNITY): Payer: Medicare (Managed Care)

## 2015-11-13 ENCOUNTER — Encounter (HOSPITAL_COMMUNITY): Payer: Self-pay | Admitting: *Deleted

## 2015-11-13 DIAGNOSIS — Z90710 Acquired absence of both cervix and uterus: Secondary | ICD-10-CM | POA: Diagnosis not present

## 2015-11-13 DIAGNOSIS — Z90721 Acquired absence of ovaries, unilateral: Secondary | ICD-10-CM | POA: Diagnosis not present

## 2015-11-13 DIAGNOSIS — R102 Pelvic and perineal pain: Secondary | ICD-10-CM | POA: Insufficient documentation

## 2015-11-13 DIAGNOSIS — F0390 Unspecified dementia without behavioral disturbance: Secondary | ICD-10-CM | POA: Insufficient documentation

## 2015-11-13 DIAGNOSIS — N949 Unspecified condition associated with female genital organs and menstrual cycle: Secondary | ICD-10-CM | POA: Diagnosis not present

## 2015-11-13 DIAGNOSIS — Z880 Allergy status to penicillin: Secondary | ICD-10-CM | POA: Insufficient documentation

## 2015-11-13 HISTORY — DX: Unspecified dementia, unspecified severity, without behavioral disturbance, psychotic disturbance, mood disturbance, and anxiety: F03.90

## 2015-11-13 LAB — URINALYSIS, ROUTINE W REFLEX MICROSCOPIC
Bilirubin Urine: NEGATIVE
GLUCOSE, UA: NEGATIVE mg/dL
Hgb urine dipstick: NEGATIVE
Ketones, ur: NEGATIVE mg/dL
Nitrite: NEGATIVE
PH: 7 (ref 5.0–8.0)
PROTEIN: NEGATIVE mg/dL
SPECIFIC GRAVITY, URINE: 1.01 (ref 1.005–1.030)

## 2015-11-13 LAB — URINE MICROSCOPIC-ADD ON
BACTERIA UA: NONE SEEN
RBC / HPF: NONE SEEN RBC/hpf (ref 0–5)

## 2015-11-13 MED ORDER — TRAMADOL HCL 50 MG PO TABS
25.0000 mg | ORAL_TABLET | Freq: Once | ORAL | Status: AC
  Administered 2015-11-13: 25 mg via ORAL
  Filled 2015-11-13: qty 1

## 2015-11-13 NOTE — MAU Provider Note (Signed)
Chief Complaint: Pelvic Pain and Rectal Pain   First Provider Initiated Contact with Patient 11/13/15 1541      SUBJECTIVE HPI: Dana Tate is a 82 y.o. [id number] who presents to maternity admissions [facility] by Dr Mayford KnifeWilliams, [facility] for evaluation of recent onset of vaginal pain.  [facility] at [facility] unable to perform pelvic exam due to lack of facilities.  The pt reports pain and is able to point to her lower abdomen and vagina as sources of the pain but due to her dementia, is unable to answer specific health questions. The pt daughter is present and reports recent changes in her mobility related to this new pain.  She has tried Tylenol for pain but it has not helped.  Per Dr Mayford KnifeWilliams, she had normal U/A 2 days ago and other physical exam has been within normal for this patient.  She has known hx of CKD with elevated BP on previous hospital admissions. She denies vaginal bleeding, vaginal itching/burning, urinary symptoms, h/a, dizziness, n/v, or fever/chills.     HPI  Past Medical History:  Diagnosis Date  . Anxiety   . Dementia   . Depression   . Dizziness   . Gait abnormality   . Hypertension   . Memory loss   . Tinea pedis   . Weight loss    Past Surgical History:  Procedure Laterality Date  . CATARACT EXTRACTION    . TUBAL LIGATION     Social History   Social History  . Marital status: Widowed    Spouse name: N/A  . Number of children: 1  . Years of education: college   Occupational History  .      retired   Social History Main Topics  . Smoking status: Never Smoker  . Smokeless tobacco: Never Used  . Alcohol use No  . Drug use: No  . Sexual activity: No   Other Topics Concern  . Not on file   Social History Narrative  . No narrative on file   No current facility-administered medications on file prior to encounter.    Current Outpatient Prescriptions on File Prior to Encounter  Medication Sig Dispense Refill  . acetaminophen  (TYLENOL) 650 MG CR tablet Take 650 mg by mouth every 8 (eight) hours as needed for pain.     Marland Kitchen. diclofenac sodium (VOLTAREN) 1 % GEL Apply 2 g topically 4 (four) times daily as needed (for pain).     . ENSURE (ENSURE) Take 237 mLs by mouth 2 (two) times daily as needed.    . risperiDONE (RISPERDAL) 0.25 MG tablet Take 0.25 mg by mouth at bedtime.     Allergies  Allergen Reactions  . Sulfa Antibiotics Itching and Rash  . Penicillins Nausea Only and Other (See Comments)    ROS:  Review of Systems  Constitutional: Negative for chills, fatigue and fever.  Respiratory: Negative for shortness of breath.   Cardiovascular: Negative for chest pain.  Gastrointestinal: Negative for constipation.  Genitourinary: Positive for pelvic pain and vaginal pain. Negative for difficulty urinating, dysuria, flank pain, vaginal bleeding and vaginal discharge.  Musculoskeletal: Positive for gait problem.  Neurological: Negative for dizziness and headaches.  Psychiatric/Behavioral: Positive for confusion.     I have reviewed patient's Past Medical Hx, Surgical Hx, Family Hx, Social Hx, medications and allergies.   Physical Exam  Patient Vitals for the past 24 hrs:  BP Temp Temp src Pulse Resp SpO2  11/13/15 1929 178/81 97.7 F (  36.5 C) - 68 20 96 %  11/13/15 1300 160/77 98.1 F (36.7 C) Oral 84 16 97 %   Constitutional: Well-developed, well-nourished female in no acute distress.  Cardiovascular: normal rate Respiratory: normal effort GI: Abd soft, non-tender. Pos BS x 4 MS: Extremities nontender, no edema, normal ROM Neurologic: Alert and oriented x 4.  GU: Neg CVAT.  PELVIC EXAM: Pt unable to tolerate speculum exam. Visual inspection of external genitalia wnl.  Bimanual exam: significant pain at introitus, palpable smooth firm mass ~3-4 cm inside introitus, ?cervix or protrusion of vaginal wall.  LAB RESULTS Results for orders placed or performed during the hospital encounter of 11/13/15 (from  the past 24 hour(s))  Urinalysis, Routine w reflex microscopic (not at [facility])     Status: Abnormal   Collection Time: 11/13/15  4:00 PM  Result Value Ref Range   Color, Urine YELLOW YELLOW   APPearance CLEAR CLEAR   Specific Gravity, Urine 1.010 1.005 - 1.030   pH 7.0 5.0 - 8.0   Glucose, UA NEGATIVE NEGATIVE mg/dL   Hgb urine dipstick NEGATIVE NEGATIVE   Bilirubin Urine NEGATIVE NEGATIVE   Ketones, ur NEGATIVE NEGATIVE mg/dL   Protein, ur NEGATIVE NEGATIVE mg/dL   Nitrite NEGATIVE NEGATIVE   Leukocytes, UA TRACE (A) NEGATIVE  Urine microscopic-add on     Status: Abnormal   Collection Time: 11/13/15  4:00 PM  Result Value Ref Range   Squamous Epithelial / LPF 0-5 (A) NONE SEEN   WBC, UA 0-5 0 - 5 WBC/hpf   RBC / HPF NONE SEEN 0 - 5 RBC/hpf   Bacteria, UA NONE SEEN NONE SEEN       IMAGING US Pelvis Complete  Result Date: 11/13/2015 CLINICAL DATA:  82 year old female with acute pelvic pain. History of hysterectomy. EXAM: TRANSABDOMINAL ULTRASOUND OF PELVIS TECHNIQUE: Transabdominal ultrasound examination of the pelvis was performed including evaluation of the uterus, ovaries, adnexal regions, and pelvic cul-de-sac. COMPARISON:  None. FINDINGS: Uterus Not visualized compatible with hysterectomy. Right ovary Not visualized Left ovary Not visualized Other findings:  No free fluid or adnexal mass. IMPRESSION: No abnormalities identified. Ovaries not visualized bilaterally. Status post hysterectomy. Electronically Signed   By: Harmon Pier M.D.   On: 11/13/2015 19:09    MAU Management/MDM: Ordered labs and [place] and reviewed results.  Consult Dr Charissa Bash, pt gerontologist, notified her of US findings of s/p hysterectomy.  Plan for pain management with Tramadol (Rx by Dr Mayford Knife) and outpatient CT scan. BP elevated, 160s-170s/80s but c/w pt previous blood pressures on file. Unable to reach Dr Mayford Knife to discuss BP so reviewed with [facility] physician who reviewed chart and found most BPs on  file c/w today's readings.  Pt is not treated for these currently.   Will no treat at discharge, pt has close follow up by PCP.  Discussed plan with pt and her daughter who state understanding. Treatments in MAU included 25 mg Tramadol prior to pelvic US. Pt denies pain at discharge.  Pt stable at time of discharge.  ASSESSMENT 1. S/P hysterectomy with oophorectomy   2. Acute pelvic pain, female     PLAN Discharge home    Medication List    STOP taking these medications   acetaminophen 650 MG CR tablet Commonly known as:  TYLENOL     TAKE these medications   diclofenac sodium 1 % Gel Commonly known as:  VOLTAREN Apply 2 g topically 4 (four) times daily as needed (for pain).   ENSURE Take 237  mLs by mouth 2 (two) times daily as needed.   mirtazapine 15 MG tablet Commonly known as:  REMERON Take 15 mg by mouth at bedtime.   psyllium 58.6 % packet Commonly known as:  METAMUCIL Take 1 packet by mouth daily.   risperiDONE 0.25 MG tablet Commonly known as:  RISPERDAL Take 0.25 mg by mouth at bedtime.      Follow-up Information    Londell Moh, MD .   Specialty:  Internal Medicine Contact information: [address] [place] [address] [phone]        Dr Dia [facility] .   Why:  Dr Mayford Knife will contact you over the weekend.           Sharen Counter Certified Nurse-Midwife 11/13/2015  8:33 PM

## 2015-11-13 NOTE — MAU Note (Addendum)
[facility]  Pt is here with her daughter and daughter states she was c/o pelvic pain and rectal pain.  Pt not eating much and not drinking much.  Pt has hx of early dementia.  Daughter states she has had UTI's in the last few months.  [facility] gave her rectal suppository and disimpacted her yesterday.  Daughter reports her mother has been really upset and screaming at her and saying she is in pain.

## 2015-11-13 NOTE — Discharge Instructions (Signed)

## 2015-11-16 ENCOUNTER — Other Ambulatory Visit: Payer: Self-pay | Admitting: Internal Medicine

## 2015-11-16 DIAGNOSIS — R102 Pelvic and perineal pain: Secondary | ICD-10-CM

## 2015-11-17 ENCOUNTER — Ambulatory Visit
Admission: RE | Admit: 2015-11-17 | Discharge: 2015-11-17 | Disposition: A | Discharge status: Home / Self Care | Payer: Medicare (Managed Care) | Source: Ambulatory Visit | Attending: Internal Medicine | Admitting: Internal Medicine

## 2015-11-17 DIAGNOSIS — R102 Pelvic and perineal pain: Secondary | ICD-10-CM

## 2016-04-05 ENCOUNTER — Emergency Department (HOSPITAL_COMMUNITY)
Admission: EM | Admit: 2016-04-05 | Discharge: 2016-04-05 | Disposition: A | Discharge status: Home / Self Care | Payer: Medicare (Managed Care) | Attending: Emergency Medicine | Admitting: Emergency Medicine

## 2016-04-05 ENCOUNTER — Emergency Department (HOSPITAL_COMMUNITY): Payer: Medicare (Managed Care)

## 2016-04-05 ENCOUNTER — Encounter (HOSPITAL_COMMUNITY): Payer: Self-pay | Admitting: Emergency Medicine

## 2016-04-05 DIAGNOSIS — S0101XA Laceration without foreign body of scalp, initial encounter: Secondary | ICD-10-CM | POA: Diagnosis present

## 2016-04-05 DIAGNOSIS — Z79899 Other long term (current) drug therapy: Secondary | ICD-10-CM | POA: Insufficient documentation

## 2016-04-05 DIAGNOSIS — I10 Essential (primary) hypertension: Secondary | ICD-10-CM | POA: Diagnosis not present

## 2016-04-05 DIAGNOSIS — S0990XA Unspecified injury of head, initial encounter: Secondary | ICD-10-CM | POA: Diagnosis not present

## 2016-04-05 DIAGNOSIS — W050XXA Fall from non-moving wheelchair, initial encounter: Secondary | ICD-10-CM | POA: Diagnosis not present

## 2016-04-05 DIAGNOSIS — N3000 Acute cystitis without hematuria: Secondary | ICD-10-CM | POA: Insufficient documentation

## 2016-04-05 DIAGNOSIS — Y939 Activity, unspecified: Secondary | ICD-10-CM | POA: Diagnosis not present

## 2016-04-05 DIAGNOSIS — Y999 Unspecified external cause status: Secondary | ICD-10-CM | POA: Diagnosis not present

## 2016-04-05 DIAGNOSIS — Y9289 Other specified places as the place of occurrence of the external cause: Secondary | ICD-10-CM | POA: Diagnosis not present

## 2016-04-05 LAB — URINALYSIS, ROUTINE W REFLEX MICROSCOPIC
Bilirubin Urine: NEGATIVE
Glucose, UA: NEGATIVE mg/dL
Ketones, ur: NEGATIVE mg/dL
Nitrite: NEGATIVE
PH: 5 (ref 5.0–8.0)
Protein, ur: NEGATIVE mg/dL
Specific Gravity, Urine: 1.029 (ref 1.005–1.030)

## 2016-04-05 MED ORDER — NITROFURANTOIN MONOHYD MACRO 100 MG PO CAPS: 100.0000 mg | ORAL_CAPSULE | Freq: Two times a day (BID) | ORAL | 0 refills | Status: DC

## 2016-04-05 NOTE — Discharge Instructions (Signed)
Staples were placed in the laceration in your scalp. These should be removed in 10 days.  Your urine suggests a bladder infection. 5 day prescription of Macrobid is given.

## 2016-04-05 NOTE — ED Triage Notes (Signed)
[facility] s/p fall from wheelchair.  EMS reports pt has hx dementia, c-collar on and aligned.  EMS reports lac to L posterior scalp, bleeding controlled at this time. Per EMS pt AO at baseline.

## 2016-04-05 NOTE — ED Notes (Signed)
ED Provider at bedside. 

## 2016-04-05 NOTE — ED Provider Notes (Addendum)
[facility]-EMERGENCY DEPT Provider Note   CSN: [id number] Arrival date & time: 04/05/16  [id number]     History   Chief Complaint Chief Complaint  Patient presents with  . Fall    HPI Dana Tate is a 83 y.o. female. Chief complaint is fall  HPI:  She presents from an extended care facility with a history of dementia. Report is that she fell from a wheelchair or chair at the facility. Has laceration to the back of her head. I'm uncertain how she would've fallen from her wheelchair and struck the back of her head. However has a laceration noted. Per EMS report taken from staff she is "at her baseline mentally".  Past Medical History:  Diagnosis Date  . Anxiety   . Dementia   . Depression   . Dizziness   . Gait abnormality   . Hypertension   . Memory loss   . Tinea pedis   . Weight loss     Patient Active Problem List   Diagnosis Date Noted  . Pre-syncope 08/30/2015  . Dementia   . Acute cystitis without hematuria     Past Surgical History:  Procedure Laterality Date  . CATARACT EXTRACTION    . TUBAL LIGATION      OB History    Gravida Para Term Preterm AB Living   3 2 1 1 1 1    SAB TAB Ectopic Multiple Live Births   1               Home Medications    Prior to Admission medications   Medication Sig Start Date End Date Taking? Authorizing Provider  diclofenac sodium (VOLTAREN) 1 % GEL Apply 2 g topically 4 (four) times daily as needed (for pain).  08/30/11   Historical Provider, MD  ENSURE (ENSURE) Take 237 mLs by mouth 2 (two) times daily as needed.    Historical Provider, MD  mirtazapine (REMERON) 15 MG tablet Take 15 mg by mouth at bedtime.    Historical Provider, MD  nitrofurantoin, macrocrystal-monohydrate, (MACROBID) 100 MG capsule Take 1 capsule (100 mg total) by mouth 2 (two) times daily. 04/05/16   Rolland PorterMark Romin Divita, MD  psyllium (METAMUCIL) 58.6 % packet Take 1 packet by mouth daily.    Historical Provider, MD  risperiDONE (RISPERDAL) 0.25 MG tablet Take 0.25 mg by  mouth at bedtime.    Historical Provider, MD    Family History Family History  Problem Relation Age of Onset  . Heart attack Father     Social History Social History  Substance Use Topics  . Smoking status: Never Smoker  . Smokeless tobacco: Never Used  . Alcohol use No     Allergies   Sulfa antibiotics and Penicillins   Review of Systems Review of Systems  Unable to perform ROS: Dementia     Physical Exam Updated Vital Signs BP 158/67 (BP Location: Left Arm)   Pulse 78   Temp 97.9 F (36.6 C) (Oral)   Resp 20   Wt 101 lb (45.8 kg)   SpO2 98%   BMI 22.64 kg/m   Physical Exam  Constitutional: She appears well-developed and well-nourished. No distress.  HENT:  Head: Normocephalic.  Eyes: Conjunctivae are normal. Pupils are equal, round, and reactive to light. No scleral icterus.  Neck: Normal range of motion. Neck supple. No thyromegaly present.  Cardiovascular: Normal rate and regular rhythm.  Exam reveals no gallop and no friction rub.   No murmur heard. Pulmonary/Chest: Effort normal and breath sounds  normal. No respiratory distress. She has no wheezes. She has no rales.  Abdominal: Soft. Bowel sounds are normal. She exhibits no distension. There is no tenderness. There is no rebound.  Musculoskeletal: Normal range of motion.  Neurological: She is alert.  Skin: Skin is warm and dry. No rash noted.  Posterior occipital laceration  Psychiatric: She has a normal mood and affect. Her behavior is normal.     ED Treatments / Results  Labs (all labs ordered are listed, but only abnormal results are displayed) Labs Reviewed  URINALYSIS, ROUTINE W REFLEX MICROSCOPIC - Abnormal; Notable for the following:       Result Value   Color, Urine AMBER (*)    APPearance HAZY (*)    Hgb urine dipstick SMALL (*)    Leukocytes, UA LARGE (*)    Bacteria, UA RARE (*)    Squamous Epithelial / LPF 0-5 (*)    All other components within normal limits    EKG  EKG  Interpretation None       Radiology Ct Head Wo Contrast  Result Date: 04/05/2016 CLINICAL DATA:  Head laceration after fall. EXAM: CT HEAD WITHOUT CONTRAST CT CERVICAL SPINE WITHOUT CONTRAST TECHNIQUE: Multidetector CT imaging of the head and cervical spine was performed following the standard protocol without intravenous contrast. Multiplanar CT image reconstructions of the cervical spine were also generated. COMPARISON:  None. FINDINGS: CT HEAD FINDINGS Brain: Mild chronic ischemic white matter disease is noted. Minimal diffuse cortical atrophy is noted. No mass effect or midline shift is noted. Ventricular size is within normal limits. There is no evidence of mass lesion, hemorrhage or acute infarction. Vascular: Atherosclerosis of carotid siphons is noted. Skull: Normal. Negative for fracture or focal lesion. Sinuses/Orbits: No acute finding. Other: None. CT CERVICAL SPINE FINDINGS Alignment: Minimal grade 1 anterolisthesis of C3-4 is noted secondary to posterior facet joint hypertrophy. Skull base and vertebrae: No acute fracture. No primary bone lesion or focal pathologic process. Soft tissues and spinal canal: No prevertebral fluid or swelling. No visible canal hematoma. Disc levels:  Mild degenerative disc disease is noted at C4-5. Upper chest: Degenerative changes seen involving the left-sided posterior facet joints. Other: None. IMPRESSION: Mild chronic ischemic white matter disease. Minimal diffuse cortical atrophy. No acute intracranial abnormality seen. Degenerative changes as described above. No acute abnormality seen in the cervical spine. Electronically Signed   By: Lupita Raider, M.D.   On: 04/05/2016 11:33   Ct Cervical Spine Wo Contrast  Result Date: 04/05/2016 CLINICAL DATA:  Head laceration after fall. EXAM: CT HEAD WITHOUT CONTRAST CT CERVICAL SPINE WITHOUT CONTRAST TECHNIQUE: Multidetector CT imaging of the head and cervical spine was performed following the standard protocol  without intravenous contrast. Multiplanar CT image reconstructions of the cervical spine were also generated. COMPARISON:  None. FINDINGS: CT HEAD FINDINGS Brain: Mild chronic ischemic white matter disease is noted. Minimal diffuse cortical atrophy is noted. No mass effect or midline shift is noted. Ventricular size is within normal limits. There is no evidence of mass lesion, hemorrhage or acute infarction. Vascular: Atherosclerosis of carotid siphons is noted. Skull: Normal. Negative for fracture or focal lesion. Sinuses/Orbits: No acute finding. Other: None. CT CERVICAL SPINE FINDINGS Alignment: Minimal grade 1 anterolisthesis of C3-4 is noted secondary to posterior facet joint hypertrophy. Skull base and vertebrae: No acute fracture. No primary bone lesion or focal pathologic process. Soft tissues and spinal canal: No prevertebral fluid or swelling. No visible canal hematoma. Disc levels:  Mild degenerative disc  disease is noted at C4-5. Upper chest: Degenerative changes seen involving the left-sided posterior facet joints. Other: None. IMPRESSION: Mild chronic ischemic white matter disease. Minimal diffuse cortical atrophy. No acute intracranial abnormality seen. Degenerative changes as described above. No acute abnormality seen in the cervical spine. Electronically Signed   By: Lupita Raider, M.D.   On: 04/05/2016 11:33    Procedures Procedures (including critical care time)  Medications Ordered in ED Medications - No data to display   Initial Impression / Assessment and Plan / ED Course  I have reviewed the triage vital signs and the nursing notes.  Pertinent labs & imaging results that were available during my care of the patient were reviewed by me and considered in my medical decision making (see chart for details).     Plan imaging of head and neck. Laceration repair.  Final Clinical Impressions(s) / ED Diagnoses   Final diagnoses:  Acute cystitis without hematuria  Laceration of  scalp, initial encounter    Laceration repaired by PA Audry Pili.  UA suggests UTI. We'll treat with Macrobid.  New Prescriptions New Prescriptions   NITROFURANTOIN, MACROCRYSTAL-MONOHYDRATE, (MACROBID) 100 MG CAPSULE    Take 1 capsule (100 mg total) by mouth 2 (two) times daily.     [name], MD 04/05/16 1403    [name], MD 04/05/16 [phone]

## 2016-04-05 NOTE — ED Notes (Signed)
Provider is currently cleaning and repairing laceration to back of patient's scalp.

## 2016-04-05 NOTE — ED Notes (Signed)
Daughter - Adele DanUsha Kapil - [phone]- [phone]

## 2016-04-05 NOTE — ED Provider Notes (Signed)
PROCEDURE: LACERATION REPAIR  Performed by Eston Estersyler M Brendon Christoffel, PA-C Consent: Informed consent, after discussion of the risks, benefits, and alternatives to the procedure, was obtained Location: posterior scalp Length: 2 cm Complexity: simple Description: linear laceration Distal CMS: Normal. No deficits. Neurovascularly intact. Anesthesia: 2% lido 2/ epi Preparation: The wound was cleaned with betadine solution and irrigated with hibiclens. The area was prepped and draped in the usual sterile fashion.  Exploration: The wound was explored and no foreign bodies were found. Procedure: The skin was closed with staples. There was good approximation, In total, THREE were used.  Post-Procedure: Good closure and hemostasis. The patient tolerated the procedure well and there were no complications. CSM remain intact. Post procedure dressing applied by RN.      Audry Piliyler Makailyn Mccormick, PA-C 04/05/16 1324    Rolland PorterMark James, MD 04/05/16 [phone]

## 2016-05-29 DIAGNOSIS — K810 Acute cholecystitis: Secondary | ICD-10-CM

## 2016-05-29 HISTORY — DX: Acute cholecystitis: K81.0

## 2016-06-13 ENCOUNTER — Inpatient Hospital Stay (HOSPITAL_COMMUNITY): Payer: Medicare (Managed Care)

## 2016-06-13 ENCOUNTER — Inpatient Hospital Stay (HOSPITAL_COMMUNITY)
Admission: EM | Admit: 2016-06-13 | Discharge: 2016-06-16 | DRG: 445 | Disposition: A | Discharge status: Skilled Nursing Facility | Payer: Medicare (Managed Care) | Attending: Internal Medicine | Admitting: Internal Medicine

## 2016-06-13 ENCOUNTER — Encounter (HOSPITAL_COMMUNITY): Payer: Self-pay | Admitting: Emergency Medicine

## 2016-06-13 ENCOUNTER — Emergency Department (HOSPITAL_COMMUNITY): Payer: Medicare (Managed Care)

## 2016-06-13 DIAGNOSIS — Z880 Allergy status to penicillin: Secondary | ICD-10-CM

## 2016-06-13 DIAGNOSIS — N201 Calculus of ureter: Secondary | ICD-10-CM | POA: Diagnosis present

## 2016-06-13 DIAGNOSIS — E785 Hyperlipidemia, unspecified: Secondary | ICD-10-CM | POA: Diagnosis present

## 2016-06-13 DIAGNOSIS — K820 Obstruction of gallbladder: Secondary | ICD-10-CM | POA: Diagnosis present

## 2016-06-13 DIAGNOSIS — F0280 Dementia in other diseases classified elsewhere without behavioral disturbance: Secondary | ICD-10-CM

## 2016-06-13 DIAGNOSIS — Z882 Allergy status to sulfonamides status: Secondary | ICD-10-CM

## 2016-06-13 DIAGNOSIS — D72829 Elevated white blood cell count, unspecified: Secondary | ICD-10-CM

## 2016-06-13 DIAGNOSIS — N202 Calculus of kidney with calculus of ureter: Secondary | ICD-10-CM | POA: Diagnosis present

## 2016-06-13 DIAGNOSIS — F5089 Other specified eating disorder: Secondary | ICD-10-CM

## 2016-06-13 DIAGNOSIS — Z91041 Radiographic dye allergy status: Secondary | ICD-10-CM | POA: Diagnosis not present

## 2016-06-13 DIAGNOSIS — Z79899 Other long term (current) drug therapy: Secondary | ICD-10-CM | POA: Diagnosis not present

## 2016-06-13 DIAGNOSIS — E872 Acidosis: Secondary | ICD-10-CM | POA: Diagnosis present

## 2016-06-13 DIAGNOSIS — K5909 Other constipation: Secondary | ICD-10-CM | POA: Diagnosis present

## 2016-06-13 DIAGNOSIS — Z978 Presence of other specified devices: Secondary | ICD-10-CM | POA: Diagnosis not present

## 2016-06-13 DIAGNOSIS — N200 Calculus of kidney: Secondary | ICD-10-CM

## 2016-06-13 DIAGNOSIS — K810 Acute cholecystitis: Secondary | ICD-10-CM | POA: Diagnosis present

## 2016-06-13 DIAGNOSIS — H9190 Unspecified hearing loss, unspecified ear: Secondary | ICD-10-CM | POA: Diagnosis present

## 2016-06-13 DIAGNOSIS — N189 Chronic kidney disease, unspecified: Secondary | ICD-10-CM | POA: Diagnosis present

## 2016-06-13 DIAGNOSIS — I129 Hypertensive chronic kidney disease with stage 1 through stage 4 chronic kidney disease, or unspecified chronic kidney disease: Secondary | ICD-10-CM | POA: Diagnosis present

## 2016-06-13 DIAGNOSIS — F0390 Unspecified dementia without behavioral disturbance: Secondary | ICD-10-CM | POA: Diagnosis present

## 2016-06-13 DIAGNOSIS — Z01810 Encounter for preprocedural cardiovascular examination: Secondary | ICD-10-CM

## 2016-06-13 DIAGNOSIS — E878 Other disorders of electrolyte and fluid balance, not elsewhere classified: Secondary | ICD-10-CM | POA: Diagnosis present

## 2016-06-13 DIAGNOSIS — D509 Iron deficiency anemia, unspecified: Secondary | ICD-10-CM | POA: Diagnosis present

## 2016-06-13 DIAGNOSIS — I10 Essential (primary) hypertension: Secondary | ICD-10-CM | POA: Diagnosis not present

## 2016-06-13 DIAGNOSIS — R740 Nonspecific elevation of levels of transaminase and lactic acid dehydrogenase [LDH]: Secondary | ICD-10-CM | POA: Diagnosis not present

## 2016-06-13 DIAGNOSIS — Z66 Do not resuscitate: Secondary | ICD-10-CM | POA: Diagnosis present

## 2016-06-13 DIAGNOSIS — K5900 Constipation, unspecified: Secondary | ICD-10-CM | POA: Diagnosis present

## 2016-06-13 DIAGNOSIS — N183 Chronic kidney disease, stage 3 (moderate): Secondary | ICD-10-CM | POA: Diagnosis present

## 2016-06-13 DIAGNOSIS — R1110 Vomiting, unspecified: Secondary | ICD-10-CM

## 2016-06-13 DIAGNOSIS — D649 Anemia, unspecified: Secondary | ICD-10-CM | POA: Diagnosis not present

## 2016-06-13 DIAGNOSIS — R1010 Upper abdominal pain, unspecified: Secondary | ICD-10-CM | POA: Diagnosis present

## 2016-06-13 DIAGNOSIS — L8990 Pressure ulcer of unspecified site, unspecified stage: Secondary | ICD-10-CM | POA: Diagnosis present

## 2016-06-13 DIAGNOSIS — R1011 Right upper quadrant pain: Secondary | ICD-10-CM

## 2016-06-13 HISTORY — DX: Chronic kidney disease, unspecified: N18.9

## 2016-06-13 HISTORY — DX: Acute cholecystitis: K81.0

## 2016-06-13 LAB — CBC WITH DIFFERENTIAL/PLATELET
Basophils Absolute: 0 10*3/uL (ref 0.0–0.1)
Basophils Relative: 0 %
EOS PCT: 0 %
Eosinophils Absolute: 0 10*3/uL (ref 0.0–0.7)
HCT: 38.4 % (ref 36.0–46.0)
Hemoglobin: 12.4 g/dL (ref 12.0–15.0)
LYMPHS ABS: 1.1 10*3/uL (ref 0.7–4.0)
LYMPHS PCT: 10 %
MCH: 30.2 pg (ref 26.0–34.0)
MCHC: 32.3 g/dL (ref 30.0–36.0)
MCV: 93.7 fL (ref 78.0–100.0)
MONO ABS: 0.5 10*3/uL (ref 0.1–1.0)
Monocytes Relative: 5 %
Neutro Abs: 9.8 10*3/uL — ABNORMAL HIGH (ref 1.7–7.7)
Neutrophils Relative %: 85 %
PLATELETS: 227 10*3/uL (ref 150–400)
RBC: 4.1 MIL/uL (ref 3.87–5.11)
RDW: 14.6 % (ref 11.5–15.5)
WBC: 11.4 10*3/uL — AB (ref 4.0–10.5)

## 2016-06-13 LAB — I-STAT CG4 LACTIC ACID, ED
Lactic Acid, Venous: 2.5 mmol/L (ref 0.5–1.9)
Lactic Acid, Venous: 2.94 mmol/L (ref 0.5–1.9)

## 2016-06-13 LAB — I-STAT TROPONIN, ED: TROPONIN I, POC: 0.02 ng/mL (ref 0.00–0.08)

## 2016-06-13 LAB — URINALYSIS, ROUTINE W REFLEX MICROSCOPIC
BILIRUBIN URINE: NEGATIVE
GLUCOSE, UA: NEGATIVE mg/dL
HGB URINE DIPSTICK: NEGATIVE
KETONES UR: NEGATIVE mg/dL
Leukocytes, UA: NEGATIVE
NITRITE: NEGATIVE
PH: 7 (ref 5.0–8.0)
Protein, ur: NEGATIVE mg/dL
SPECIFIC GRAVITY, URINE: 1.027 (ref 1.005–1.030)

## 2016-06-13 LAB — COMPREHENSIVE METABOLIC PANEL
ALT: 75 U/L — AB (ref 14–54)
AST: 196 U/L — AB (ref 15–41)
Albumin: 3.1 g/dL — ABNORMAL LOW (ref 3.5–5.0)
Alkaline Phosphatase: 100 U/L (ref 38–126)
Anion gap: 12 (ref 5–15)
BILIRUBIN TOTAL: 0.8 mg/dL (ref 0.3–1.2)
BUN: 36 mg/dL — AB (ref 6–20)
CALCIUM: 8.8 mg/dL — AB (ref 8.9–10.3)
CHLORIDE: 104 mmol/L (ref 101–111)
CO2: 24 mmol/L (ref 22–32)
CREATININE: 1.4 mg/dL — AB (ref 0.44–1.00)
GFR calc Af Amer: 37 mL/min — ABNORMAL LOW (ref >60)
GFR, EST NON AFRICAN AMERICAN: 32 mL/min — AB (ref >60)
Glucose, Bld: 144 mg/dL — ABNORMAL HIGH (ref 65–99)
Potassium: 4.1 mmol/L (ref 3.5–5.1)
Sodium: 140 mmol/L (ref 135–145)
TOTAL PROTEIN: 6.8 g/dL (ref 6.5–8.1)

## 2016-06-13 LAB — ECHOCARDIOGRAM COMPLETE
Height: 60 in
WEIGHTICAEL: 1616 [oz_av]

## 2016-06-13 LAB — LACTIC ACID, PLASMA
LACTIC ACID, VENOUS: 2.8 mmol/L — AB (ref 0.5–1.9)
Lactic Acid, Venous: 2.7 mmol/L (ref 0.5–1.9)

## 2016-06-13 LAB — LIPASE, BLOOD: LIPASE: 46 U/L (ref 11–51)

## 2016-06-13 MED ORDER — PIPERACILLIN-TAZOBACTAM IN DEX 2-0.25 GM/50ML IV SOLN
2.2500 g | Freq: Three times a day (TID) | INTRAVENOUS | Status: DC
  Administered 2016-06-13 – 2016-06-16 (×10): 2.25 g via INTRAVENOUS
  Filled 2016-06-13 (×12): qty 50

## 2016-06-13 MED ORDER — SODIUM CHLORIDE 0.9 % IV BOLUS (SEPSIS)
1000.0000 mL | Freq: Once | INTRAVENOUS | Status: AC
  Administered 2016-06-13: 1000 mL via INTRAVENOUS

## 2016-06-13 MED ORDER — MORPHINE SULFATE (PF) 4 MG/ML IV SOLN
INTRAVENOUS | Status: AC
  Filled 2016-06-13: qty 1

## 2016-06-13 MED ORDER — MORPHINE SULFATE (PF) 4 MG/ML IV SOLN
1.8000 mg | Freq: Once | INTRAVENOUS | Status: AC
  Administered 2016-06-13: 1.8 mg via INTRAVENOUS

## 2016-06-13 MED ORDER — IOPAMIDOL (ISOVUE-300) INJECTION 61%
INTRAVENOUS | Status: AC
  Administered 2016-06-13: 75 mL
  Filled 2016-06-13: qty 75

## 2016-06-13 MED ORDER — TAMSULOSIN HCL 0.4 MG PO CAPS
0.4000 mg | ORAL_CAPSULE | Freq: Every day | ORAL | Status: DC
  Administered 2016-06-13: 0.4 mg via ORAL
  Filled 2016-06-13: qty 1

## 2016-06-13 MED ORDER — ONDANSETRON HCL 4 MG PO TABS: 4.0000 mg | ORAL_TABLET | Freq: Four times a day (QID) | ORAL | Status: DC | PRN

## 2016-06-13 MED ORDER — SODIUM CHLORIDE 0.9 % IV SOLN
INTRAVENOUS | Status: DC
  Administered 2016-06-13: 125 mL/h via INTRAVENOUS
  Administered 2016-06-13: 18:00:00 via INTRAVENOUS
  Administered 2016-06-14: 1 mL via INTRAVENOUS

## 2016-06-13 MED ORDER — METRONIDAZOLE IN NACL 5-0.79 MG/ML-% IV SOLN
500.0000 mg | Freq: Once | INTRAVENOUS | Status: AC
  Administered 2016-06-13: 500 mg via INTRAVENOUS
  Filled 2016-06-13: qty 100

## 2016-06-13 MED ORDER — HYDROCODONE-ACETAMINOPHEN 5-325 MG PO TABS
1.0000 | ORAL_TABLET | ORAL | Status: DC | PRN
  Administered 2016-06-14: 2 via ORAL
  Administered 2016-06-15 – 2016-06-16 (×4): 1 via ORAL
  Filled 2016-06-13 (×2): qty 1
  Filled 2016-06-13: qty 2
  Filled 2016-06-13: qty 1
  Filled 2016-06-13: qty 2
  Filled 2016-06-13: qty 1

## 2016-06-13 MED ORDER — HYDRALAZINE HCL 20 MG/ML IJ SOLN
5.0000 mg | Freq: Three times a day (TID) | INTRAMUSCULAR | Status: DC | PRN
  Administered 2016-06-13: 5 mg via INTRAVENOUS
  Filled 2016-06-13: qty 1

## 2016-06-13 MED ORDER — TECHNETIUM TC 99M MEBROFENIN IV KIT
5.4200 | PACK | Freq: Once | INTRAVENOUS | Status: AC | PRN
  Administered 2016-06-13: 5.42 via INTRAVENOUS

## 2016-06-13 MED ORDER — SENNOSIDES-DOCUSATE SODIUM 8.6-50 MG PO TABS: 1.0000 | ORAL_TABLET | Freq: Every evening | ORAL | Status: DC | PRN

## 2016-06-13 MED ORDER — IOPAMIDOL (ISOVUE-300) INJECTION 61%
INTRAVENOUS | Status: AC
  Filled 2016-06-13: qty 30

## 2016-06-13 MED ORDER — SODIUM CHLORIDE 0.9% FLUSH
3.0000 mL | Freq: Two times a day (BID) | INTRAVENOUS | Status: DC
  Administered 2016-06-13: 3 mL via INTRAVENOUS

## 2016-06-13 MED ORDER — BISACODYL 10 MG RE SUPP: 10.0000 mg | Freq: Every day | RECTAL | Status: DC | PRN

## 2016-06-13 MED ORDER — MAGNESIUM CITRATE PO SOLN: 1.0000 | Freq: Once | ORAL | Status: DC | PRN

## 2016-06-13 MED ORDER — MIRTAZAPINE 7.5 MG PO TABS
7.5000 mg | ORAL_TABLET | Freq: Every day | ORAL | Status: DC
  Administered 2016-06-13 – 2016-06-15 (×3): 7.5 mg via ORAL
  Filled 2016-06-13 (×4): qty 1

## 2016-06-13 MED ORDER — SODIUM CHLORIDE 0.9 % IV SOLN
INTRAVENOUS | Status: DC
  Administered 2016-06-13: 04:00:00 via INTRAVENOUS

## 2016-06-13 MED ORDER — DEXTROSE 5 % IV SOLN
1.0000 g | Freq: Once | INTRAVENOUS | Status: AC
  Administered 2016-06-13: 1 g via INTRAVENOUS
  Filled 2016-06-13: qty 10

## 2016-06-13 MED ORDER — ONDANSETRON HCL 4 MG/2ML IJ SOLN
4.0000 mg | Freq: Once | INTRAMUSCULAR | Status: AC
  Administered 2016-06-13: 4 mg via INTRAVENOUS
  Filled 2016-06-13: qty 2

## 2016-06-13 MED ORDER — ACETAMINOPHEN 325 MG PO TABS
650.0000 mg | ORAL_TABLET | Freq: Three times a day (TID) | ORAL | Status: DC
  Administered 2016-06-13 – 2016-06-16 (×7): 650 mg via ORAL
  Filled 2016-06-13 (×7): qty 2

## 2016-06-13 MED ORDER — ONDANSETRON HCL 4 MG/2ML IJ SOLN
4.0000 mg | Freq: Four times a day (QID) | INTRAMUSCULAR | Status: DC | PRN
  Administered 2016-06-14: 4 mg via INTRAVENOUS
  Filled 2016-06-13: qty 2

## 2016-06-13 MED ORDER — FENTANYL CITRATE (PF) 100 MCG/2ML IJ SOLN
50.0000 ug | Freq: Once | INTRAMUSCULAR | Status: AC
  Administered 2016-06-13: 50 ug via INTRAVENOUS
  Filled 2016-06-13: qty 2

## 2016-06-13 NOTE — ED Notes (Signed)
Dennie Bible, RN from [facility] called for report on patient. They have advised nursing staff with be in this am for evaluation.

## 2016-06-13 NOTE — ED Notes (Addendum)
Echo at bedside

## 2016-06-13 NOTE — ED Triage Notes (Signed)
Per EMS: pt has had worsening Abd pain x1 day.  Pt was given an enema and had runny stool.  Pt also given 350 of tylenol x3.    VSS

## 2016-06-13 NOTE — ED Notes (Signed)
Patient transported to hgida scan.

## 2016-06-13 NOTE — Progress Notes (Signed)
Pharmacy Antibiotic Note  Dana Tate is a 83 y.o. female admitted on 06/13/2016 with acute cholecystitis.  Pharmacy has been consulted for zosyn dosing. Pt is afebrile and WBC is mildly elevated at 11.4. Scr is elevated at 1.4. Lactic acid is elevated at 2.94.   Plan: Zosyn 2.25gm IV Q8H F/u renal fxn, C&S, clinical status   Height: 5' (152.4 cm) Weight: 101 lb (45.8 kg) IBW/kg (Calculated) : 45.5  Temp (24hrs), Avg:97.5 F (36.4 C), Min:97.4 F (36.3 C), Max:97.7 F (36.5 C)   Recent Labs Lab 06/13/16 0159 06/13/16 0202 06/13/16 0515  WBC  --  11.4*  --   CREATININE  --  1.40*  --   LATICACIDVEN 2.50*  --  2.94*    Estimated Creatinine Clearance: 18.4 mL/min (A) (by C-G formula based on SCr of 1.4 mg/dL (H)).    Allergies  Allergen Reactions  . Sulfa Antibiotics Itching and Rash  . Orange (Diagnostic) Other (See Comments)    On MAR  . Penicillins Nausea Only and Other (See Comments)    Antimicrobials this admission: Zosyn 4/16>> CTX x 1 4/16 Flagyl x 1 4/16  Dose adjustments this admission: N/A  Microbiology results: Pending  Thank you for allowing pharmacy to be a part of this patient's care.  [name], Drake Leach 06/13/2016 7:35 AM

## 2016-06-13 NOTE — ED Notes (Addendum)
Pt cleaned of urine and linen changed after up to bedpan for 100cc urine and small amount loose stool.. Explained to pt she was going to hida scan for stomach.

## 2016-06-13 NOTE — ED Notes (Signed)
Pt in CT at this time.

## 2016-06-13 NOTE — Consult Note (Signed)
[facility] Surgery Consult Note  Dana Tate 1922/05/07  [id number].    Requesting MD: Ward, DO Chief Complaint/Reason for Consult: RUQ pain  HPI:  83 y.o. Hindi speaking Female with a PMH HTN, HLD, chronic pelvic pain, and dementia who presented to the ED vis EMS from [facility] with 4 days of abdominal pain. Translator was used to obtain patient history but history was limited due to patient being a poor historian/interpreter differences in dialect. Per nursing staff patients daughter was at bedside but just left temporarily and should return to help with interpretation. Patient is currently laying in bed with hands over her abdomen. Per EDP notes, patient reports upper abdominal pain described as severe. Associated symptoms include vomiting. According to home med list patient does not take any blood thinning medications. According to chart review she has a history of tubal ligation and no history of DM, MI, or CVA.  ED workup significant for Lactate 2.94,WBC 11.4, AST 196, ALT 75, Alk phos and t.bilirubin WNL, UA negative.  CT abd/pelvis - Trace perihepatic ascites. There is mild gallbladder wall edema. There is contrast enhancement of the gallbladder fossa.  ROS: ROS unable to obtain 2/2 language barrier.   Family History  Problem Relation Age of Onset  . Heart attack Father     Past Medical History:  Diagnosis Date  . Anxiety   . Dementia   . Depression   . Dizziness   . Gait abnormality   . Hypertension   . Memory loss   . Tinea pedis   . Weight loss     Past Surgical History:  Procedure Laterality Date  . CATARACT EXTRACTION    . TUBAL LIGATION      Social History:  reports that she has never smoked. She has never used smokeless tobacco. She reports that she does not drink alcohol or use drugs.  Allergies:  Allergies  Allergen Reactions  . Sulfa Antibiotics Itching and Rash  . Orange (Diagnostic) Other (See Comments)    On MAR  . Penicillins Nausea  Only and Other (See Comments)     (Not in a hospital admission)  Blood pressure (!) 177/84, pulse 62, temperature 97.7 F (36.5 C), temperature source Rectal, resp. rate 20, height 5' (1.524 m), weight 45.8 kg (101 lb), SpO2 99 %. Physical Exam: Physical Exam  Constitutional: She appears well-developed.  cachectic  HENT:  Head: Normocephalic and atraumatic.  Right Ear: External ear normal.  Left Ear: External ear normal.  Mouth/Throat: Oropharynx is clear and moist.  Eyes: EOM are normal. Pupils are equal, round, and reactive to light. No scleral icterus.  Neck: Normal range of motion. Neck supple. No tracheal deviation present. No thyromegaly present.  Cardiovascular: Normal rate, regular rhythm, normal heart sounds and intact distal pulses.  Exam reveals no gallop and no friction rub.   No murmur heard. Pulmonary/Chest: Effort normal and breath sounds normal. No respiratory distress. She has no wheezes. She has no rales. She exhibits no tenderness.  Abdominal: Soft. Bowel sounds are normal. She exhibits no distension and no mass. There is tenderness. There is no rebound and no guarding. No hernia.  Musculoskeletal: Normal range of motion. She exhibits no edema or deformity.  Neurological: She is alert.  Pleasantly demented   Skin: Skin is warm and dry. No rash noted. She is not diaphoretic. No erythema.   Results for orders placed or performed during the hospital encounter of 06/13/16 (from the past 48 hour(s))  I-stat troponin, ED  Status: None   Collection Time: 06/13/16  1:57 AM  Result Value Ref Range   Troponin i, poc 0.02 0.00 - 0.08 ng/mL   Comment 3            Comment: Due to the release kinetics of cTnI, a negative result within the first hours of the onset of symptoms does not rule out myocardial infarction with certainty. If myocardial infarction is still suspected, repeat the test at appropriate intervals.   I-Stat CG4 Lactic Acid, ED     Status: Abnormal    Collection Time: 06/13/16  1:59 AM  Result Value Ref Range   Lactic Acid, Venous 2.50 (HH) 0.5 - 1.9 mmol/L   Comment NOTIFIED PHYSICIAN   CBC with Differential     Status: Abnormal   Collection Time: 06/13/16  2:02 AM  Result Value Ref Range   WBC 11.4 (H) 4.0 - 10.5 K/uL   RBC 4.10 3.87 - 5.11 MIL/uL   Hemoglobin 12.4 12.0 - 15.0 g/dL   HCT 38.4 36.0 - 46.0 %   MCV 93.7 78.0 - 100.0 fL   MCH 30.2 26.0 - 34.0 pg   MCHC 32.3 30.0 - 36.0 g/dL   RDW 14.6 11.5 - 15.5 %   Platelets 227 150 - 400 K/uL   Neutrophils Relative % 85 %   Neutro Abs 9.8 (H) 1.7 - 7.7 K/uL   Lymphocytes Relative 10 %   Lymphs Abs 1.1 0.7 - 4.0 K/uL   Monocytes Relative 5 %   Monocytes Absolute 0.5 0.1 - 1.0 K/uL   Eosinophils Relative 0 %   Eosinophils Absolute 0.0 0.0 - 0.7 K/uL   Basophils Relative 0 %   Basophils Absolute 0.0 0.0 - 0.1 K/uL  Comprehensive metabolic panel     Status: Abnormal   Collection Time: 06/13/16  2:02 AM  Result Value Ref Range   Sodium 140 135 - 145 mmol/L   Potassium 4.1 3.5 - 5.1 mmol/L   Chloride 104 101 - 111 mmol/L   CO2 24 22 - 32 mmol/L   Glucose, Bld 144 (H) 65 - 99 mg/dL   BUN 36 (H) 6 - 20 mg/dL   Creatinine, Ser 1.40 (H) 0.44 - 1.00 mg/dL   Calcium 8.8 (L) 8.9 - 10.3 mg/dL   Total Protein 6.8 6.5 - 8.1 g/dL   Albumin 3.1 (L) 3.5 - 5.0 g/dL   AST 196 (H) 15 - 41 U/L   ALT 75 (H) 14 - 54 U/L   Alkaline Phosphatase 100 38 - 126 U/L   Total Bilirubin 0.8 0.3 - 1.2 mg/dL   GFR calc non Af Amer 32 (L) >60 mL/min   GFR calc Af Amer 37 (L) >60 mL/min    Comment: (NOTE) The eGFR has been calculated using the CKD EPI equation. This calculation has not been validated in all clinical situations. eGFR's persistently <60 mL/min signify possible Chronic Kidney Disease.    Anion gap 12 5 - 15  Lipase, blood     Status: None   Collection Time: 06/13/16  2:02 AM  Result Value Ref Range   Lipase 46 11 - 51 U/L  I-Stat CG4 Lactic Acid, ED     Status: Abnormal    Collection Time: 06/13/16  5:15 AM  Result Value Ref Range   Lactic Acid, Venous 2.94 (HH) 0.5 - 1.9 mmol/L   Comment NOTIFIED PHYSICIAN   Urinalysis, Routine w reflex microscopic     Status: None   Collection Time: 06/13/16  5:28  AM  Result Value Ref Range   Color, Urine YELLOW YELLOW   APPearance CLEAR CLEAR   Specific Gravity, Urine 1.027 1.005 - 1.030   pH 7.0 5.0 - 8.0   Glucose, UA NEGATIVE NEGATIVE mg/dL   Hgb urine dipstick NEGATIVE NEGATIVE   Bilirubin Urine NEGATIVE NEGATIVE   Ketones, ur NEGATIVE NEGATIVE mg/dL   Protein, ur NEGATIVE NEGATIVE mg/dL   Nitrite NEGATIVE NEGATIVE   Leukocytes, UA NEGATIVE NEGATIVE   Ct Abdomen Pelvis W Contrast  Result Date: 06/13/2016 CLINICAL DATA:  Abdominal pain EXAM: CT ABDOMEN AND PELVIS WITH CONTRAST TECHNIQUE: Multidetector CT imaging of the abdomen and pelvis was performed using the standard protocol following bolus administration of intravenous contrast. CONTRAST:  66m ISOVUE-300 IOPAMIDOL (ISOVUE-300) INJECTION 61% COMPARISON:  CT abdomen pelvis 11/17/2015 FINDINGS: Lower chest: There are bilateral small pleural effusions and associated atelectasis. There are coronary artery calcifications. Hepatobiliary: Trace perihepatic ascites. There is mild gallbladder wall edema. There is contrast enhancement of the gallbladder fossa. Pancreas: Normal pancreatic contours and enhancement. No peripancreatic fluid collection or pancreatic ductal dilatation. Spleen: Normal. Adrenals/Urinary Tract: Normal adrenal glands. Severe right renal atrophy. There is a 4 mm stone within the proximal left ureter. No hydronephrosis or perinephric stranding. On the excretory phase images, however, excreted contrast does pass the level of the stone. Stomach/Bowel: No abnormal bowel dilatation. No bowel wall thickening or adjacent fat stranding to indicate acute inflammation. No abdominal fluid collection. Normal appendix. Small hiatal hernia. Vascular/Lymphatic: There is  atherosclerotic calcification of the non aneurysmal abdominal aorta. No abdominal or pelvic adenopathy. Reproductive: Bilateral tubal ligation. Unremarkable uterus. No free fluid in the pelvis. Musculoskeletal: There is no bony spinal canal stenosis. No lytic or blastic lesions. Normal visualized extrathoracic and extraperitoneal soft tissues. Other: No contributory non-categorized findings. IMPRESSION: 1. Gallbladder wall edema and contrast enhancement of the gallbladder fossa, concerning for acute cholecystitis. Nuclear medicine hepatobiliary scan may be helpful for confirmation. 2. Left ureterolithiasis with 4 mm stone at the left ureteropelvic junction with no associated hydronephrosis. 3. Small bilateral pleural effusions and associated atelectasis. 4. Coronary artery and aortic atherosclerosis. Electronically Signed   By: KUlyses JarredM.D.   On: 06/13/2016 04:50    Assessment/Plan RUQ Pain Elevated LFT's  Leukocytosis - NPO, IVF, IV Rocephin, pain control/antiemetics  - HIDA to confirm acute cholecystitis  - If HIDA is positive, will likely recommend percutaneous cholecystostomy tube rather than surgery due to patients age and co-morbidities.  - admit to medicine service for management of multiple medical problems   EJill Alexanders PColiseum Medical CentersSurgery 06/13/2016, 7:25 AM Pager: [phone]: [phone]-Fri 7:00 am-4:30 pm Sat-Sun 7:00 am-11:30 am

## 2016-06-13 NOTE — ED Provider Notes (Signed)
By signing my name below, I, Dana Tate, attest that this documentation has been prepared under the direction and in the presence of Dana Cadmus N Aralynn Brake, DO. Electronically signed, Dana Tate, ED Scribe. 06/13/16. 1:19 AM.   TIME SEEN: 1:00 AM   CHIEF COMPLAINT: Abdominal Pain  LEVEL 5 CAVEAT: HPI and ROS limited due to Dementia   HPI: Dana Tate is a 83 y.o. female with h/o HTN, HLD, chronic pelvic pain, dementia transported via EMS from [facility] to the Emergency Department with concern for upper abdominal pain onset x "4 days". Patient speaks Hindi. History is very limited as patient is a poor historian. History is limited with interpreter as well. Pt describes moderate to severe, constant pain. Unclear if there is any radiation of pain. Unclear if there are any aggravating or relieving factors. Unable to describe the pain further. States she has had vomiting. Pt denies fever, chest pain and diarrhea.     Patient has DNR paperwork with her.   PAST MEDICAL HISTORY/PAST SURGICAL HISTORY:  Past Medical History:  Diagnosis Date  . Anxiety   . Dementia   . Depression   . Dizziness   . Gait abnormality   . Hypertension   . Memory loss   . Tinea pedis   . Weight loss     MEDICATIONS:  Prior to Admission medications   Medication Sig Start Date End Date Taking? Authorizing Provider  diclofenac sodium (VOLTAREN) 1 % GEL Apply 2 g topically 4 (four) times daily as needed (for pain).  08/30/11   Historical Provider, MD  ENSURE (ENSURE) Take 237 mLs by mouth 2 (two) times daily as needed.    Historical Provider, MD  mirtazapine (REMERON) 15 MG tablet Take 15 mg by mouth at bedtime.    Historical Provider, MD  nitrofurantoin, macrocrystal-monohydrate, (MACROBID) 100 MG capsule Take 1 capsule (100 mg total) by mouth 2 (two) times daily. 04/05/16   [name], MD  psyllium (METAMUCIL) 58.6 % packet Take 1 packet by mouth daily.    Historical Provider, MD  risperiDONE (RISPERDAL) 0.25 MG  tablet Take 0.25 mg by mouth at bedtime.    Historical Provider, MD    ALLERGIES:  Allergies  Allergen Reactions  . Sulfa Antibiotics Itching and Rash  . Penicillins Nausea Only and Other (See Comments)    SOCIAL HISTORY:  Social History  Substance Use Topics  . Smoking status: Never Smoker  . Smokeless tobacco: Never Used  . Alcohol use No    FAMILY HISTORY: Family History  Problem Relation Age of Onset  . Heart attack Father     EXAM: BP (!) 175/84 (BP Location: Left Arm)   Pulse 76   Temp 97.4 F (36.3 C) (Oral)   Resp 16   Ht 5' (1.524 m)   Wt 101 lb (45.8 kg)   SpO2 96%   BMI 19.73 kg/m  CONSTITUTIONAL: Alert; elderly; chronically ill appearing; Moaning in pain HEAD: Normocephalic EYES: Conjunctivae clear, PERRL ENT: normal nose; no rhinorrhea; dry mucous membranes; pharynx without lesions noted NECK: Supple, no meningismus, no LAD  CARD: RRR; S1 and S2 appreciated; no murmurs, no clicks, no rubs, no gallops RESP: Normal chest excursion without splinting or tachypnea; breath sounds clear and equal bilaterally; no wheezes, no rhonchi, no rales,  ABD/GI: Normal bowel sounds; non-distended; soft, diffusely tender throughout the abdomen, no rebound, no guarding BACK:  The back appears normal and is non-tender to palpation, there is no CVA tenderness EXT: Normal ROM in all joints;  non-tender to palpation; no edema; normal capillary refill; no cyanosis    SKIN: Normal color for age and race; warm NEURO: Moves all extremities equally PSYCH: The patient's mood and manner are appropriate. Grooming and personal hygiene are appropriate.  MEDICAL DECISION MAKING: Patient here with complaints of abdominal pain, vomiting. Very difficult to obtain a history from patient given her history of dementia. This is not improved using a Hindi interpreter. We have contacted her daughter who is on their way to the emergency department. We'll obtain labs, urine, EKG, CT of her abdomen and  pelvis. Differential diagnosis includes colitis, diverticulitis, bowel obstruction, pancreatitis, cholecystitis, appendicitis, UTI, constipation. It is unclear if patient has ever had abdominal surgeries.  ED PROGRESS: 2:15 AM  Pt's daughter now at bedside. Daughter reports patient recently new to [facility] because the daughter herself had an injury to her right arm and is no longer able to assist patient. She states that the patient has been complaining of right-sided abdominal pain for 4 days and the nursing facility thought this could be constipation have been treating her with Tylenol. Daughter reports that she has had a partial hysterectomy but does not believe she has had any other abdominal surgeries. Daughter is not aware of any vomiting or diarrhea.  Daughter at this time agrees with keeping patient a DO NOT RESUSCITATE.   5:00 AM  Pt has a mild leukocytosis with left shift. AST, ALT elevated but normal lipase. Lactate mildly elevated and she is receiving IV fluids. Troponin negative. CT scan shows acute cholecystitis. We'll give ceftriaxone, Flagyl. We'll discuss with general surgery.  5:17 AM  D/w Dr. Donell Beers with general surgery. They will see the patient in consult. Given patient's age and comorbidities she is requesting I discussed with medicine for admission. Patient's PCP is [name]. Patient's daughter still at bedside and has been updated with plan. We'll continue IV fluids. Rectal temperature here is normal. We'll keep her NPO.  Patient's lactate is going up despite receiving 1 L of IV fluids. Will give another liter bolus. IV antibiotics have been ordered as well.  5:36 AM Discussed patient's case with hospitalist, Dr. Toniann Fail.  I have recommended admission and patient (and family if present) agree with this plan. Admitting physician will place admission orders.   I reviewed all nursing notes, vitals, pertinent previous records, EKGs, lab and urine results, imaging (as  available).     EKG Interpretation  Date/Time:  Monday June 13 2016 02:15:50 EDT Ventricular Rate:  72 PR Interval:    QRS Duration: 82 QT Interval:  430 QTC Calculation: 471 R Axis:   -21 Text Interpretation:  Sinus rhythm Borderline left axis deviation No significant change since last tracing Confirmed by Gloyd Happ,  DO, Shareta Fishbaugh ([id number]) on 06/13/2016 2:20:04 AM        I personally performed the services described in this documentation, which was scribed in my presence. The recorded information has been reviewed and is accurate.    Layla Maw Kelsie Zaborowski, DO 06/13/16 [phone]

## 2016-06-13 NOTE — ED Notes (Signed)
[facility] contacted and states they will come for pt.

## 2016-06-13 NOTE — ED Notes (Signed)
Attempted to call report

## 2016-06-13 NOTE — Progress Notes (Signed)
  Echocardiogram 2D Echocardiogram has been performed.  Dana Tate 06/13/2016, 8:58 AM

## 2016-06-13 NOTE — ED Notes (Signed)
Pt's daughter Adele Dan [phone] has spoken with surgeon at bedside. She will return after getting her own meds and breakfast.

## 2016-06-13 NOTE — H&P (Signed)
History and Physical    Dana Tate:[id number] DOB: 1922-03-11 DOA: 06/13/2016   PCP: Thane Edu, MD  [facility]   Patient coming from:  Home    Chief Complaint: Abdominal pain   HPI: Dana Tate is a 83 y.o. female with medical history significant for Dementia, Depression, HTN, hard of hearing, brought by EMS from [facility] assisted living to the ED, with 2 to 3 day history of upper abdominal pain, described by her daughter who is the main historian, as severe, constant, radiating to the back, aggravated by taking deep breaths. There is no alleviating factors. The symptoms are associated with nausea, and intermittent chills. She does have subjective fevers. The patient is able to produce flatus. She does have a history of chronic constipation, but her last bowel movement was one day ago. There is no blood in the stools. Her appetite was essentially normal. No dysuria or gross hematuria. Denies any chest pain, or shortness of breath. No leg swelling. As mentioned above, the patient has a history of mild dementia, but there is no indication of acute confusion. No history of cholecystitis, appendicitis, bowel obstruction, or recent UTI. She has a history of remote hysterectomy, but no other abdominal surgeries. Surgical consult is pending. The patient is DNR.  ED Course:  BP (!) 177/84   Pulse 62   Temp 97.7 F (36.5 C) (Rectal)   Resp 20   Ht 5' (1.524 m)   Wt 45.8 kg (101 lb)   SpO2 99%   BMI 19.73 kg/m   sodium 140 potassium 4.1 creatinine 1.4  GFR 32 calcium 8.8 A gap 12. Glucose 144 alkaline phosphatase 100 lipase 46 AST when 96 ALT 75 total protein 6.8 bilirubin 0.8 lactic acid was initially 2.5, now increasing to 2.94 white count 11.4, with mild left shift. Hemoglobin 12.4 platelets 227 urinalysis negative for nitrites or blood   received 2 L IV fluids, then at 125 cc/h  and was given one dose of Rocephin  and Flagyl EKG with sinus rhythm,  borderline LAD, no significant changes since her last tracing  CT abdomen and pelvis without contrast :  Gallbladder wall edema and contrast enhancement of the gallbladder fossa, concerning for acute cholecystitis.   2. Left ureterolithiasis with 4 mm stone at the left ureteropelvic junction with no associated hydronephrosis.   Review of Systems: As per HPI otherwise 10 point review of systems negative.   Past Medical History:  Diagnosis Date  . Anxiety   . Dementia   . Depression   . Dizziness   . Gait abnormality   . Hypertension   . Memory loss   . Tinea pedis   . Weight loss     Past Surgical History:  Procedure Laterality Date  . CATARACT EXTRACTION    . TUBAL LIGATION      Social History Social History   Social History  . Marital status: Widowed    Spouse name: N/A  . Number of children: 1  . Years of education: college   Occupational History  .      retired   Social History Main Topics  . Smoking status: Never Smoker  . Smokeless tobacco: Never Used  . Alcohol use No  . Drug use: No  . Sexual activity: No   Other Topics Concern  . Not on file   Social History Narrative  . No narrative on file     Allergies  Allergen Reactions  . Sulfa Antibiotics  Itching and Rash  . Orange (Diagnostic) Other (See Comments)    On MAR  . Penicillins Nausea Only and Other (See Comments)    Family History  Problem Relation Age of Onset  . Heart attack Father       Prior to Admission medications   Medication Sig Start Date End Date Taking? Authorizing Provider  acetaminophen (TYLENOL ARTHRITIS PAIN) 650 MG CR tablet Take 650 mg by mouth 3 (three) times daily as needed for pain.   Yes Historical Provider, MD  bisacodyl (DULCOLAX) 10 MG suppository Place 10 mg rectally daily as needed for moderate constipation.   Yes Historical Provider, MD  ENSURE (ENSURE) Take 237 mLs by mouth 3 (three) times daily.    Yes Historical Provider, MD  magnesium hydroxide (MILK  OF MAGNESIA) 400 MG/5ML suspension Take 30 mLs by mouth daily as needed for mild constipation.   Yes Historical Provider, MD  Menthol, Topical Analgesic, (BIOFREEZE EX) Apply 1 application topically 2 (two) times daily as needed (pain). 3.5% GEL   Yes Historical Provider, MD  mirtazapine (REMERON) 15 MG tablet Take 7.5 mg by mouth at bedtime.    Yes Historical Provider, MD  NON FORMULARY Take 120 mLs by mouth 3 (three) times daily. MED PASS   Yes Historical Provider, MD  polyethylene glycol (MIRALAX / GLYCOLAX) packet Take 17 g by mouth daily as needed for mild constipation.   Yes Historical Provider, MD  senna-docusate (SENOKOT-S) 8.6-50 MG tablet Take 2 tablets by mouth at bedtime.   Yes Historical Provider, MD  Sodium Phosphates (RA SALINE ENEMA RE) Place 1 Applicatorful rectally daily as needed (constipatin).   Yes Historical Provider, MD    Physical Exam:  Vitals:   06/13/16 0545 06/13/16 0615 06/13/16 0645 06/13/16 0715  BP: (!) 178/83 (!) 178/86 (!) 186/81 (!) 177/84  Pulse: 71 68 65 62  Resp: (!) Temp:      TempSrc:      SpO2: 100% 100% 100% 99%  Weight:      Height:       Constitutional: ill appearing, NAD, known dementia but able to understand simple commands .  Eyes: PERRL, lids and conjunctivae normal ENMT: Mucous membranes are moist, without exudate or lesions  Neck: normal, supple, no masses, no thyromegaly Respiratory: clear to auscultation bilaterally, no wheezing, no crackles. Normal respiratory effort  Cardiovascular: Regular rate and rhythm, no murmurs, rubs or gallops. No extremity edema. 2+ pedal pulses. No carotid bruits.  Abdomen: diffusely tender without rebound or guarding , No hepatosplenomegaly. Bowel sounds positive.  Musculoskeletal: no clubbing / cyanosis. Moves all extremities Skin: no jaundice, No lesions.  Neurologic: Sensation intact  Strength equal in all extremities,able to follow simple commands .     Labs on Admission: I have  personally reviewed following labs and imaging studies  CBC:  Recent Labs Lab 06/13/16 0202  WBC 11.4*  NEUTROABS 9.8*  HGB 12.4  HCT 38.4  MCV 93.7  PLT 227    Basic Metabolic Panel:  Recent Labs Lab 06/13/16 0202  NA 140  K 4.1  CL 104  CO2 24  GLUCOSE 144*  BUN 36*  CREATININE 1.40*  CALCIUM 8.8*    GFR: Estimated Creatinine Clearance: 18.4 mL/min (A) (by C-G formula based on SCr of 1.4 mg/dL (H)).  Liver Function Tests:  Recent Labs Lab 06/13/16 0202  AST 196*  ALT 75*  ALKPHOS 100  BILITOT 0.8  PROT 6.8  ALBUMIN 3.1*  Recent Labs Lab 06/13/16 0202  LIPASE 46   No results for input(s): AMMONIA in the last 168 hours.  Coagulation Profile: No results for input(s): INR, PROTIME in the last 168 hours.  Cardiac Enzymes: No results for input(s): CKTOTAL, CKMB, CKMBINDEX, TROPONINI in the last 168 hours.  BNP (last 3 results) No results for input(s): PROBNP in the last 8760 hours.  HbA1C: No results for input(s): HGBA1C in the last 72 hours.  CBG: No results for input(s): GLUCAP in the last 168 hours.  Lipid Profile: No results for input(s): CHOL, HDL, LDLCALC, TRIG, CHOLHDL, LDLDIRECT in the last 72 hours.  Thyroid Function Tests: No results for input(s): TSH, T4TOTAL, FREET4, T3FREE, THYROIDAB in the last 72 hours.  Anemia Panel: No results for input(s): VITAMINB12, FOLATE, FERRITIN, TIBC, IRON, RETICCTPCT in the last 72 hours.  Urine analysis:    Component Value Date/Time   COLORURINE YELLOW 06/13/2016 0528   APPEARANCEUR CLEAR 06/13/2016 0528   LABSPEC 1.027 06/13/2016 0528   PHURINE 7.0 06/13/2016 0528   GLUCOSEU NEGATIVE 06/13/2016 0528   HGBUR NEGATIVE 06/13/2016 0528   BILIRUBINUR NEGATIVE 06/13/2016 0528   KETONESUR NEGATIVE 06/13/2016 0528   PROTEINUR NEGATIVE 06/13/2016 0528   NITRITE NEGATIVE 06/13/2016 0528   LEUKOCYTESUR NEGATIVE 06/13/2016 0528    Sepsis Labs: (procalcitonin:4,lacticidven:4) )No  results found for this or any previous visit (from the past 240 hour(s)).   Radiological Exams on Admission: Ct Abdomen Pelvis W Contrast  Result Date: 06/13/2016 CLINICAL DATA:  Abdominal pain EXAM: CT ABDOMEN AND PELVIS WITH CONTRAST TECHNIQUE: Multidetector CT imaging of the abdomen and pelvis was performed using the standard protocol following bolus administration of intravenous contrast. CONTRAST:  75mL ISOVUE-300 IOPAMIDOL (ISOVUE-300) INJECTION 61% COMPARISON:  CT abdomen pelvis 11/17/2015 FINDINGS: Lower chest: There are bilateral small pleural effusions and associated atelectasis. There are coronary artery calcifications. Hepatobiliary: Trace perihepatic ascites. There is mild gallbladder wall edema. There is contrast enhancement of the gallbladder fossa. Pancreas: Normal pancreatic contours and enhancement. No peripancreatic fluid collection or pancreatic ductal dilatation. Spleen: Normal. Adrenals/Urinary Tract: Normal adrenal glands. Severe right renal atrophy. There is a 4 mm stone within the proximal left ureter. No hydronephrosis or perinephric stranding. On the excretory phase images, however, excreted contrast does pass the level of the stone. Stomach/Bowel: No abnormal bowel dilatation. No bowel wall thickening or adjacent fat stranding to indicate acute inflammation. No abdominal fluid collection. Normal appendix. Small hiatal hernia. Vascular/Lymphatic: There is atherosclerotic calcification of the non aneurysmal abdominal aorta. No abdominal or pelvic adenopathy. Reproductive: Bilateral tubal ligation. Unremarkable uterus. No free fluid in the pelvis. Musculoskeletal: There is no bony spinal canal stenosis. No lytic or blastic lesions. Normal visualized extrathoracic and extraperitoneal soft tissues. Other: No contributory non-categorized findings. IMPRESSION: 1. Gallbladder wall edema and contrast enhancement of the gallbladder fossa, concerning for acute cholecystitis. Nuclear medicine  hepatobiliary scan may be helpful for confirmation. 2. Left ureterolithiasis with 4 mm stone at the left ureteropelvic junction with no associated hydronephrosis. 3. Small bilateral pleural effusions and associated atelectasis. 4. Coronary artery and aortic atherosclerosis. Electronically Signed   By: Deatra Robinson M.D.   On: 06/13/2016 04:50    EKG: Independently reviewed.  Assessment/Plan Active Problems:   Dementia   Acute cholecystitis   Nephrolithiasis   Hypertension   Chronic kidney disease     Acute abdominal pain likely due to cholecystitis. Abd CT confirms same. Afebrile.  alkaline phosphatase 100 lipase 46 AST when 96 ALT 75. white count 11.4, with  mild left shift Blood culture pending .lactic acid was initially 2.5, now increasing to 2.94 Urinalysis negative for nitrites or blood  .received 2 L IV fluids, then at 125 cc/h  and was given one dose of Rocephin  and Flagyl.   Admit to Inpatient telemetry   NPO until seen by Surgery. Consult called to Dr.Byerly. HIDA scan ordered by Surgery  IVF at 125 cc/h while NPO. EKG / 2 D echo  Serial lactic acid  IV Zosyn  for  intraabdominal coverage   Chronic kidney disease stage III  baseline creatinine 1.3-1.4     Current Cr 1.4 . CT abdomen and pelvis without contrast  Left ureterolithiasis with 4 mm stone at the left ureteropelvic junction with no associated hydronephrosis.  Lab Results  Component Value Date   CREATININE 1.40 (H) 06/13/2016   CREATININE 1.32 (H) 08/31/2015   CREATININE 1.48 (H) 08/30/2015  IVF Repeat CMET in am   Hypertension BP  177/84   Pulse 62 . Patient on no home antihypertensives  Add Hydralazine Q6 hours as needed for BP 160/90    Dementia No acute issues  Anxiety/Insomnia Continue Remeron nightly   Chronic constipation. Last bowel movement on 4/15  Continue with daily laxatives   Deconditioning  OT eval   DVT prophylaxis: SCD's   Code Status:   DNR Family Communication:  Discussed with  daughter  Disposition Plan: Expect patient to be discharged to [facility]  after condition improves Consults called:   Surgery (Dr. Donell Beers)  Admission status: Inpatient tele   Marcos Eke, PA-C [facility]   06/13/2016, 8:01 AM

## 2016-06-13 NOTE — ED Notes (Signed)
Pt returns from [facility]. HH nurse at [facility]

## 2016-06-13 NOTE — Progress Notes (Signed)
   Discussed care plan w/ [facility]MT[facility], Dr Lawerance Bach, who has graciously agreed to assume care of pt on 06/14/16. Pt is cared for in the outpt setting by [facility] who falls under their stewardship.   Shelly Flatten, MD [facility] 06/13/2016, 4:42 PM

## 2016-06-14 ENCOUNTER — Inpatient Hospital Stay (HOSPITAL_COMMUNITY): Payer: Medicare (Managed Care)

## 2016-06-14 ENCOUNTER — Encounter (HOSPITAL_COMMUNITY): Payer: Self-pay | Admitting: Interventional Radiology

## 2016-06-14 DIAGNOSIS — R740 Nonspecific elevation of levels of transaminase and lactic acid dehydrogenase [LDH]: Secondary | ICD-10-CM

## 2016-06-14 DIAGNOSIS — D649 Anemia, unspecified: Secondary | ICD-10-CM

## 2016-06-14 DIAGNOSIS — Z79899 Other long term (current) drug therapy: Secondary | ICD-10-CM

## 2016-06-14 DIAGNOSIS — Z978 Presence of other specified devices: Secondary | ICD-10-CM

## 2016-06-14 DIAGNOSIS — L8990 Pressure ulcer of unspecified site, unspecified stage: Secondary | ICD-10-CM | POA: Diagnosis present

## 2016-06-14 HISTORY — PX: IR PERC CHOLECYSTOSTOMY: IMG2326

## 2016-06-14 LAB — CBC
HEMATOCRIT: 32.8 % — AB (ref 36.0–46.0)
HEMOGLOBIN: 10.5 g/dL — AB (ref 12.0–15.0)
MCH: 30 pg (ref 26.0–34.0)
MCHC: 32 g/dL (ref 30.0–36.0)
MCV: 93.7 fL (ref 78.0–100.0)
Platelets: 205 10*3/uL (ref 150–400)
RBC: 3.5 MIL/uL — ABNORMAL LOW (ref 3.87–5.11)
RDW: 14.5 % (ref 11.5–15.5)
WBC: 8.1 10*3/uL (ref 4.0–10.5)

## 2016-06-14 LAB — COMPREHENSIVE METABOLIC PANEL
ALBUMIN: 2.2 g/dL — AB (ref 3.5–5.0)
ALK PHOS: 82 U/L (ref 38–126)
ALT: 65 U/L — ABNORMAL HIGH (ref 14–54)
ANION GAP: 11 (ref 5–15)
AST: 100 U/L — ABNORMAL HIGH (ref 15–41)
BILIRUBIN TOTAL: 0.6 mg/dL (ref 0.3–1.2)
BUN: 22 mg/dL — ABNORMAL HIGH (ref 6–20)
CALCIUM: 7.7 mg/dL — AB (ref 8.9–10.3)
CO2: 18 mmol/L — ABNORMAL LOW (ref 22–32)
CREATININE: 1.33 mg/dL — AB (ref 0.44–1.00)
Chloride: 112 mmol/L — ABNORMAL HIGH (ref 101–111)
GFR calc non Af Amer: 34 mL/min — ABNORMAL LOW (ref >60)
GFR, EST AFRICAN AMERICAN: 39 mL/min — AB (ref >60)
GLUCOSE: 75 mg/dL (ref 65–99)
Potassium: 4.2 mmol/L (ref 3.5–5.1)
Sodium: 141 mmol/L (ref 135–145)
TOTAL PROTEIN: 5.2 g/dL — AB (ref 6.5–8.1)

## 2016-06-14 LAB — PROTIME-INR
INR: 1.16
Prothrombin Time: 14.9 seconds (ref 11.4–15.2)

## 2016-06-14 LAB — LACTIC ACID, PLASMA: Lactic Acid, Venous: 1.2 mmol/L (ref 0.5–1.9)

## 2016-06-14 MED ORDER — HEPARIN SODIUM (PORCINE) 5000 UNIT/ML IJ SOLN
5000.0000 [IU] | Freq: Three times a day (TID) | INTRAMUSCULAR | Status: DC
  Administered 2016-06-15 – 2016-06-16 (×5): 5000 [IU] via SUBCUTANEOUS
  Filled 2016-06-14 (×5): qty 1

## 2016-06-14 MED ORDER — FENTANYL CITRATE (PF) 100 MCG/2ML IJ SOLN
INTRAMUSCULAR | Status: AC | PRN
  Administered 2016-06-14 (×2): 25 ug via INTRAVENOUS

## 2016-06-14 MED ORDER — LIDOCAINE HCL 1 % IJ SOLN
INTRAMUSCULAR | Status: AC
  Filled 2016-06-14: qty 20

## 2016-06-14 MED ORDER — FENTANYL CITRATE (PF) 100 MCG/2ML IJ SOLN
INTRAMUSCULAR | Status: AC
Start: 2016-06-14 — End: 2016-06-15
  Filled 2016-06-14: qty 2

## 2016-06-14 MED ORDER — MIDAZOLAM HCL 2 MG/2ML IJ SOLN
INTRAMUSCULAR | Status: AC | PRN
  Administered 2016-06-14 (×3): 0.5 mg via INTRAVENOUS

## 2016-06-14 MED ORDER — MIDAZOLAM HCL 2 MG/2ML IJ SOLN
INTRAMUSCULAR | Status: AC
  Filled 2016-06-14: qty 2

## 2016-06-14 MED ORDER — LIDOCAINE HCL (PF) 1 % IJ SOLN
INTRAMUSCULAR | Status: AC | PRN
  Administered 2016-06-14: 10 mL

## 2016-06-14 MED ORDER — DEXTROSE-NACL 5-0.45 % IV SOLN
INTRAVENOUS | Status: DC
  Administered 2016-06-14: 11:00:00 via INTRAVENOUS

## 2016-06-14 MED ORDER — IOPAMIDOL (ISOVUE-300) INJECTION 61%
INTRAVENOUS | Status: AC
  Administered 2016-06-14: 10 mL
  Filled 2016-06-14: qty 50

## 2016-06-14 NOTE — Progress Notes (Signed)
Date: 06/14/2016               Patient Name:  Dana Tate MRN: [id number]  DOB: April 26, 1922 Age / Sex: 83 y.o.,  female   PCP: Jethro Bastos, MD         Medical Service: Internal Medicine Teaching Service         Attending Physician: Dr. Heide Spark    First Contact: Dr. Mikey Bussing Pager: [phone]  Second Contact: Dr. Allena Katz Pager: [phone]       After Hours (After 5p/  First Contact Pager: [phone]  weekends / holidays): Second Contact Pager: [phone]   Chief Complaint: abdominal pain  Hospital Course: Dana Tate is a 21 F with PMHx of HTN, HLD, Chronic Pelvic Pain, Constipation, and Dementia who presented from [facility] on 4/16 with complaint of RUQ abdominal pain.  History is provided per chart review, patient and patient's daughter at bedside. 4 days ago, patient developed acute onset of RUQ pain with radiation to the epigastric region and to her back. Pain was severe, worse with eating and associated with nausea. She denied associated fever, chills, vomiting, diarrhea, chest pain or shortness of breath. No reports of melena or hematochezia. Given patient's history of chronic pelvic pain and h/o constipation in the setting of patient's dementia, patient was presumed to have chronic pain from constipation and her pelvic pain at her ALF. For three days, patient was treated with tylenol and laxatives without improvement in her abdominal pain. Given its persistence, patient was transferred to [facility] ED for further evaluation.  In the ED on 4/16, patient was found to be afebrile, hypertensive at 175/84, HR 76, RR 16 and satting well on room air. Initial laboratory work up showed mild leukocytosis at 11.4 with left shift, Hgb 12.4, creatinine of 1.4. AST elevated at 196, ALT 75, normal lipase. Troponin 0.02. Lactic acid elevated at 2.5. EKG was sinus rhythm. CT abdomen/pelvis was obtained which showed gallbladder wall thickening concerning for acute cholecystitis, left  ureterolithiasis 4 mm at UPJ without hydronephrosis. Urinalysis was not indicative of infection. Patient was seen by general surgery given concern for acute cholecystitis. Their recommendations were to obtain HIDA and if positive, to consider percutaneous cholecystostomy tube via IR. HIDA was positive for cystic duct obstruction indicative of acute cholecystitis. Patient was started on Ceftriaxone and Flagyl, then transitioned to Zosyn and admitted to medicine.  Subjective: Patient was seen and examined this morning with her daughter at bedside. Patient continues to have RUQ pain radiating to epigastric region and to her back. She denies nausea, vomiting, fever or chills. No chest pain or shortness of breath. Her daughter informs me that patient speaks English and would be offended if we used an interpreter service (contradictory to previous notes). Overnight, patient did well without acute events. She remains afebrile.   Physical Exam: Blood pressure 126/70, pulse 68, temperature 97.9 F (36.6 C), temperature source Axillary, resp. rate 16, height 5' (1.524 m), weight 163 lb 9.3 oz (74.2 kg), SpO2 98 %. General: Vital signs reviewed.  Patient is elderly, in no acute distress and cooperative with exam.  Eyes: Conjunctivae normal, no scleral icterus.   Cardiovascular: RRR,  no murmurs, gallops, or rubs. No lower extremity edema bilaterally. Bilateral radial and pedal pulses are intact and symmetric bilaterally.  Pulmonary: Mild inspiratory crackles in lower lung fields bilaterally, no wheezes. No accessory muscle use. Gastrointestinal: Soft, tender to palpation in RUQ and epigastric region, non-distended, BS hypoactive, no guarding  present.  Neurologic: Awake and alert, oriented to self, moving all extremities Skin: Warm, dry and intact.  Psychiatric: Normal mood and affect. Cognition and memory are abnormal.   EKG: Sinus rhythm, isolated TWI in lead III  CT Abdomen/Pelvis: 1. Gallbladder wall  edema and contrast enhancement of the gallbladder fossa, concerning for acute cholecystitis. Nuclear medicine hepatobiliary scan may be helpful for confirmation. 2. Left ureterolithiasis with 4 mm stone at the left ureteropelvic junction with no associated hydronephrosis. 3. Small bilateral pleural effusions and associated atelectasis. 4. Coronary artery and aortic atherosclerosis.  HIDA: No appreciable radiotracer accumulation in the gallbladder indicating cystic duct obstruction compatible with acute cholecystitis in the appropriate clinical setting.  Assessment & Plan by Problem: Principal Problem:   Acute cholecystitis Active Problems:   Dementia   Constipation   Hypertension   Chronic kidney disease   Ureterolithiasis   Pressure injury of skin  Dana Tate is a 24 F with PMHx of HTN, HLD, Chronic Pelvic Pain, Constipation, and Dementia who presented from [facility] on 4/16 with complaint of RUQ abdominal pain and found to have Acute Cholecystitis.  Acute Cholecystitis:  Patient presents with a 3 day history of RUQ abdominal pain worse after eating. Afebrile, leukocytosis initially elevated at 11 and now resolved at 8. AST and ALT elevated, but improved this morning. Alkaline phosphatase and T. Bilirubin normal. CT abdomen/pelvis concerning for acute cholecystitis given GB wall edema. HIDA scan confirmed acute cholecystitis. Patient remains of Zosyn. BCx pending. Plan is for percutaneous cholecystostomy tube via IR given patient is not a surgical candidate per general surgery. -IR percutaneous cholecystostomy tube -Continue Zosyn 4/16 >> -NPO for procedure -Slowly advance diet following procedure -Continue Norco 5-325 mg 1-2 tablets Q4H prn -Continue Zofran prn -D5-1/2 NS at 50 cc/hr while NPO -Repeat CMET/CBC tomorrow am -BCx 4/16 >>  Lactic Acidosis: Lactic acid on admission mildly elevated at 2.5. Repeat 2.9 despite IVF. Repeat Lactic acid from this am is pending.    -Lactic acid pending -Continue D5-1/2 NS at 50 cc/hr  Non-gapped Metabolic Acidosis: In the setting of hyperchloremia. Possibly secondary to large amounts of NS administered since admission.  -Switch to D5-1/2 NS, given lower glucose of 75 and hyperchloremia -Repeat CMET tomorrow am  Normocytic Anemia: Hgb dropped from 12.4 to 10.5 this morning. Likely dilutional. No evidence of blood loss. -Repeat CBC tomorrow am  HTN: Hypertensive on admission, now normotensive. Not on home antihypertensives. Received one dose of hydralazine yesterday. -Continue hydralazine 5-10 mg Q8H prn  Chronic Constipation: Patient is on multple laxative and stool softeners at home. One bowel movement yesterday in ED. -Continue senokot prn -Continue Dulcolax prn  DVT/PE ppx: SCDs, start heparin post procedure FEN: NPO, restart clears after procedure CODE: DNR  Dispo: Admit patient to Inpatient with expected length of stay greater than 2 midnights.  Signed: Karlene Lineman, DO PGY-3 Internal Medicine Resident Pager # [phone] 06/14/2016 9:28 AM

## 2016-06-14 NOTE — Progress Notes (Signed)
[facility] Progress Note     Subjective: CC: acute cholecystitis  Pt laying in bed. Daughter and family medicine team at bedside. Pt reports that she is comfortable.  She speaks english fluently and answers questions appropriately.   Objective: Vital signs in last 24 hours: Temp:  [97.9 F (36.6 C)-99.3 F (37.4 C)] 97.9 F (36.6 C) (04/17 0515) Pulse Rate:  [56-93] 68 (04/17 0515) Resp:  [16-28] 16 (04/17 0515) BP: (119-196)/(56-119) 126/70 (04/17 0515) SpO2:  [96 %-100 %] 98 % (04/17 0515) Weight:  [74.2 kg (163 lb 9.3 oz)] 74.2 kg (163 lb 9.3 oz) (04/17 0500) Last BM Date: 06/12/16  Intake/Output from previous day: 04/16 0701 - 04/17 0700 In: 2698.4 [P.O.:60; I.V.:2488.4; IV Piggyback:150] Out: 500 [Urine:500] Intake/Output this shift: No intake/output data recorded.  PE: Gen:  Somnolent, arousable, no acute distress HEENT: external exam of bilateral ears normal, pt hard of hearing, no scleral icterus  Card:  Regular rate and rhythm Pulm:  Normal respiratory effort  Abd: Soft, TTP epigastrium, RUQ, LUQ. No peritonitis. Bowel sounds present. Ext:  No erythema, edema, or tenderness Neuro: alert, following commands   Lab Results:   Recent Labs  06/13/16 0202 06/14/16 0807  WBC 11.4* 8.1  HGB 12.4 10.5*  HCT 38.4 32.8*  PLT 227 205   BMET  Recent Labs  06/13/16 0202 06/14/16 0807  NA 140 141  K 4.1 4.2  CL 104 112*  CO2 24 18*  GLUCOSE 144* 75  BUN 36* 22*  CREATININE 1.40* 1.33*  CALCIUM 8.8* 7.7*   PT/INR  Recent Labs  06/14/16 0807  LABPROT 14.9  INR 1.16   CMP     Component Value Date/Time   NA 141 06/14/2016 0807   K 4.2 06/14/2016 0807   CL 112 (H) 06/14/2016 0807   CO2 18 (L) 06/14/2016 0807   GLUCOSE 75 06/14/2016 0807   BUN 22 (H) 06/14/2016 0807   CREATININE 1.33 (H) 06/14/2016 0807   CALCIUM 7.7 (L) 06/14/2016 0807   PROT 5.2 (L) 06/14/2016 0807   ALBUMIN 2.2 (L) 06/14/2016 0807   AST 100 (H) 06/14/2016 0807    ALT 65 (H) 06/14/2016 0807   ALKPHOS 82 06/14/2016 0807   BILITOT 0.6 06/14/2016 0807   GFRNONAA 34 (L) 06/14/2016 0807   GFRAA 39 (L) 06/14/2016 0807   Lipase     Component Value Date/Time   LIPASE 46 06/13/2016 0202       Studies/Results: Nm Hepatobiliary Liver Func  Result Date: 06/13/2016 CLINICAL DATA:  83 y/o F; right upper quadrant pain, vomiting, and leukocytosis. EXAM: NUCLEAR MEDICINE HEPATOBILIARY IMAGING TECHNIQUE: Sequential images of the abdomen were obtained out to 60 minutes following intravenous administration of radiopharmaceutical. After administration of morphine 30 minutes and additional imaging was performed. RADIOPHARMACEUTICALS:  5.42 mCi Tc-33m Choletec IV, 1.8 mg morphine. COMPARISON:  06/13/2016 CT of the abdomen and pelvis. FINDINGS: Prompt uptake and biliary excretion of activity by the liver is seen. Intermittent reflux into the proximal duodenum/ gastric antrum. Free passage of tracer into the bowel indicating common bile duct patency. No appreciable radiotracer accumulation in gallbladder fossa indicating cystic duct obstruction. IMPRESSION: No appreciable radiotracer accumulation in the gallbladder indicating cystic duct obstruction compatible with acute cholecystitis in the appropriate clinical setting. These results will be called to the ordering clinician or representative by the Radiologist Assistant, and communication documented in the PACS or zVision Dashboard. Electronically Signed   By: Mitzi Hansen M.D.   On: 06/13/2016 15:48  Ct Abdomen Pelvis W Contrast  Result Date: 06/13/2016 CLINICAL DATA:  Abdominal pain EXAM: CT ABDOMEN AND PELVIS WITH CONTRAST TECHNIQUE: Multidetector CT imaging of the abdomen and pelvis was performed using the standard protocol following bolus administration of intravenous contrast. CONTRAST:  75mL ISOVUE-300 IOPAMIDOL (ISOVUE-300) INJECTION 61% COMPARISON:  CT abdomen pelvis 11/17/2015 FINDINGS: Lower chest:  There are bilateral small pleural effusions and associated atelectasis. There are coronary artery calcifications. Hepatobiliary: Trace perihepatic ascites. There is mild gallbladder wall edema. There is contrast enhancement of the gallbladder fossa. Pancreas: Normal pancreatic contours and enhancement. No peripancreatic fluid collection or pancreatic ductal dilatation. Spleen: Normal. Adrenals/Urinary Tract: Normal adrenal glands. Severe right renal atrophy. There is a 4 mm stone within the proximal left ureter. No hydronephrosis or perinephric stranding. On the excretory phase images, however, excreted contrast does pass the level of the stone. Stomach/Bowel: No abnormal bowel dilatation. No bowel wall thickening or adjacent fat stranding to indicate acute inflammation. No abdominal fluid collection. Normal appendix. Small hiatal hernia. Vascular/Lymphatic: There is atherosclerotic calcification of the non aneurysmal abdominal aorta. No abdominal or pelvic adenopathy. Reproductive: Bilateral tubal ligation. Unremarkable uterus. No free fluid in the pelvis. Musculoskeletal: There is no bony spinal canal stenosis. No lytic or blastic lesions. Normal visualized extrathoracic and extraperitoneal soft tissues. Other: No contributory non-categorized findings. IMPRESSION: 1. Gallbladder wall edema and contrast enhancement of the gallbladder fossa, concerning for acute cholecystitis. Nuclear medicine hepatobiliary scan may be helpful for confirmation. 2. Left ureterolithiasis with 4 mm stone at the left ureteropelvic junction with no associated hydronephrosis. 3. Small bilateral pleural effusions and associated atelectasis. 4. Coronary artery and aortic atherosclerosis. Electronically Signed   By: Deatra Robinson M.D.   On: 06/13/2016 04:50    Anti-infectives: Anti-infectives    Start     Dose/Rate Route Frequency Ordered Stop   06/13/16 0800  piperacillin-tazobactam (ZOSYN) IVPB 2.25 g     2.25 g 100 mL/hr over 30  Minutes Intravenous Every 8 hours 06/13/16 0732     06/13/16 0500  cefTRIAXone (ROCEPHIN) 1 g in dextrose 5 % 50 mL IVPB     1 g 100 mL/hr over 30 Minutes Intravenous  Once 06/13/16 0458 06/13/16 0723   06/13/16 0500  metroNIDAZOLE (FLAGYL) IVPB 500 mg     500 mg 100 mL/hr over 60 Minutes Intravenous  Once 06/13/16 0458 06/13/16 0723     Assessment/Plan  Acute cholecystitis - HIDA 06/13/16 positive for cystic duct obstruction - Percutaneous Cholecystostomy tube today by IR - general surgery will follow    LOS: 1 day    Adam Phenix , [facility] 06/14/2016, 10:12 AM Pager: [phone] Consults: [phone] Mon-Fri 7:00 am-4:30 pm Sat-Sun 7:00 am-11:30 am

## 2016-06-14 NOTE — Sedation Documentation (Signed)
O2 3l/[place] applied 

## 2016-06-14 NOTE — Consult Note (Signed)
Chief Complaint: Patient was seen in consultation today for abdominal pain  Referring Physician(s):  Dr. Violeta Gelinas  Supervising Physician: Jolaine Click  Patient Status: [name] - In-pt  History of Present Illness: Dana Tate is a 83 y.o. female with past medical history of CKD, dementia, HTN, and anxiety/depression who presents to [facility] from SNF with abdominal pain.   CT Abd/Pelvis 06/13/16 shows: 1. Gallbladder wall edema and contrast enhancement of the gallbladder fossa, concerning for acute cholecystitis. Nuclear medicine hepatobiliary scan may be helpful for confirmation. 2. Left ureterolithiasis with 4 mm stone at the left ureteropelvic junction with no associated hydronephrosis. 3. Small bilateral pleural effusions and associated atelectasis. 4. Coronary artery and aortic atherosclerosis.  HIDA scan performed yesterday showed: No appreciable radiotracer accumulation in the gallbladder indicating cystic duct obstruction compatible with acute cholecystitis in the appropriate clinical setting.  Patient assessed by surgery who feels she is high risk.  IR consulted for percutaneous cholecystostomy placement which would likely be indefinite.   Patient has been NPO.   Past Medical History:  Diagnosis Date  . Acute cholecystitis 05/2016  . Anxiety   . Chronic kidney disease   . Dementia   . Depression   . Dizziness   . Gait abnormality   . Hypertension   . Memory loss   . Tinea pedis   . Weight loss     Past Surgical History:  Procedure Laterality Date  . CATARACT EXTRACTION    . TUBAL LIGATION      Allergies: Sulfa antibiotics; Orange (diagnostic); and Penicillins  Medications: Prior to Admission medications   Medication Sig Start Date End Date Taking? Authorizing Provider  acetaminophen (TYLENOL ARTHRITIS PAIN) 650 MG CR tablet Take 650 mg by mouth 3 (three) times daily as needed for pain.   Yes Historical Provider, MD  bisacodyl (DULCOLAX) 10 MG  suppository Place 10 mg rectally daily as needed for moderate constipation.   Yes Historical Provider, MD  ENSURE (ENSURE) Take 237 mLs by mouth 3 (three) times daily.    Yes Historical Provider, MD  magnesium hydroxide (MILK OF MAGNESIA) 400 MG/5ML suspension Take 30 mLs by mouth daily as needed for mild constipation.   Yes Historical Provider, MD  Menthol, Topical Analgesic, (BIOFREEZE EX) Apply 1 application topically 2 (two) times daily as needed (pain). 3.5% GEL   Yes Historical Provider, MD  mirtazapine (REMERON) 15 MG tablet Take 7.5 mg by mouth at bedtime.    Yes Historical Provider, MD  NON FORMULARY Take 120 mLs by mouth 3 (three) times daily. MED PASS   Yes Historical Provider, MD  polyethylene glycol (MIRALAX / GLYCOLAX) packet Take 17 g by mouth daily as needed for mild constipation.   Yes Historical Provider, MD  senna-docusate (SENOKOT-S) 8.6-50 MG tablet Take 2 tablets by mouth at bedtime.   Yes Historical Provider, MD  Sodium Phosphates (RA SALINE ENEMA RE) Place 1 Applicatorful rectally daily as needed (constipatin).   Yes Historical Provider, MD     Family History  Problem Relation Age of Onset  . Heart attack Father     Social History   Social History  . Marital status: Widowed    Spouse name: N/A  . Number of children: 1  . Years of education: college   Occupational History  .      retired   Social History Main Topics  . Smoking status: Never Smoker  . Smokeless tobacco: Never Used  . Alcohol use No  . Drug use:  No  . Sexual activity: No   Other Topics Concern  . None   Social History Narrative  . None    Review of Systems  Unable to perform ROS: Dementia    Vital Signs: BP 126/70 (BP Location: Left Arm)   Pulse 68   Temp 97.9 F (36.6 C) (Axillary)   Resp 16   Ht 5' (1.524 m)   Wt 163 lb 9.3 oz (74.2 kg)   SpO2 98%   BMI 31.95 kg/m   Physical Exam  Constitutional: She appears well-developed.  Cardiovascular: Normal rate, regular rhythm  and normal heart sounds.   Pulmonary/Chest: Effort normal and breath sounds normal. No respiratory distress.  Abdominal: There is tenderness.  Neurological:  Sleeping soundly  Psychiatric:  Dementia at baseline  Nursing note and vitals reviewed.   Mallampati Score:  MD Evaluation Airway: WNL Heart: WNL Abdomen: WNL Chest/ Lungs: WNL ASA  Classification: 3 Mallampati/Airway Score: Two  Imaging: Nm Hepatobiliary Liver Func  Result Date: 06/13/2016 CLINICAL DATA:  83 y/o F; right upper quadrant pain, vomiting, and leukocytosis. EXAM: NUCLEAR MEDICINE HEPATOBILIARY IMAGING TECHNIQUE: Sequential images of the abdomen were obtained out to 60 minutes following intravenous administration of radiopharmaceutical. After administration of morphine 30 minutes and additional imaging was performed. RADIOPHARMACEUTICALS:  5.42 mCi Tc-17m Choletec IV, 1.8 mg morphine. COMPARISON:  06/13/2016 CT of the abdomen and pelvis. FINDINGS: Prompt uptake and biliary excretion of activity by the liver is seen. Intermittent reflux into the proximal duodenum/ gastric antrum. Free passage of tracer into the bowel indicating common bile duct patency. No appreciable radiotracer accumulation in gallbladder fossa indicating cystic duct obstruction. IMPRESSION: No appreciable radiotracer accumulation in the gallbladder indicating cystic duct obstruction compatible with acute cholecystitis in the appropriate clinical setting. These results will be called to the ordering clinician or representative by the Radiologist Assistant, and communication documented in the PACS or zVision Dashboard. Electronically Signed   By: Mitzi Hansen M.D.   On: 06/13/2016 15:48   Ct Abdomen Pelvis W Contrast  Result Date: 06/13/2016 CLINICAL DATA:  Abdominal pain EXAM: CT ABDOMEN AND PELVIS WITH CONTRAST TECHNIQUE: Multidetector CT imaging of the abdomen and pelvis was performed using the standard protocol following bolus  administration of intravenous contrast. CONTRAST:  75mL ISOVUE-300 IOPAMIDOL (ISOVUE-300) INJECTION 61% COMPARISON:  CT abdomen pelvis 11/17/2015 FINDINGS: Lower chest: There are bilateral small pleural effusions and associated atelectasis. There are coronary artery calcifications. Hepatobiliary: Trace perihepatic ascites. There is mild gallbladder wall edema. There is contrast enhancement of the gallbladder fossa. Pancreas: Normal pancreatic contours and enhancement. No peripancreatic fluid collection or pancreatic ductal dilatation. Spleen: Normal. Adrenals/Urinary Tract: Normal adrenal glands. Severe right renal atrophy. There is a 4 mm stone within the proximal left ureter. No hydronephrosis or perinephric stranding. On the excretory phase images, however, excreted contrast does pass the level of the stone. Stomach/Bowel: No abnormal bowel dilatation. No bowel wall thickening or adjacent fat stranding to indicate acute inflammation. No abdominal fluid collection. Normal appendix. Small hiatal hernia. Vascular/Lymphatic: There is atherosclerotic calcification of the non aneurysmal abdominal aorta. No abdominal or pelvic adenopathy. Reproductive: Bilateral tubal ligation. Unremarkable uterus. No free fluid in the pelvis. Musculoskeletal: There is no bony spinal canal stenosis. No lytic or blastic lesions. Normal visualized extrathoracic and extraperitoneal soft tissues. Other: No contributory non-categorized findings. IMPRESSION: 1. Gallbladder wall edema and contrast enhancement of the gallbladder fossa, concerning for acute cholecystitis. Nuclear medicine hepatobiliary scan may be helpful for confirmation. 2. Left ureterolithiasis with 4 mm  stone at the left ureteropelvic junction with no associated hydronephrosis. 3. Small bilateral pleural effusions and associated atelectasis. 4. Coronary artery and aortic atherosclerosis. Electronically Signed   By: Deatra Robinson M.D.   On: 06/13/2016 04:50     Labs:  CBC:  Recent Labs  08/30/15 1354 08/31/15 0034 06/13/16 0202 06/14/16 0807  WBC 7.4 7.6 11.4* 8.1  HGB 12.4 11.3* 12.4 10.5*  HCT 39.7 35.2* 38.4 32.8*  PLT 200 188 227 205    COAGS:  Recent Labs  06/14/16 0807  INR 1.16    BMP:  Recent Labs  08/30/15 1354 08/31/15 0034 06/13/16 0202 06/14/16 0807  NA 141 139 140 141  K 4.2 4.0 4.1 4.2  CL 110 107 104 112*  CO2 18*  GLUCOSE 90 94 144* 75  BUN 25* 22* 36* 22*  CALCIUM 9.5 9.4 8.8* 7.7*  CREATININE 1.48* 1.32* 1.40* 1.33*  GFRNONAA 30* 34* 32* 34*  GFRAA 34* 40* 37* 39*    LIVER FUNCTION TESTS:  Recent Labs  06/13/16 0202 06/14/16 0807  BILITOT 0.8 0.6  AST 196* 100*  ALT 75* 65*  ALKPHOS 100 82  PROT 6.8 5.2*  ALBUMIN 3.1* 2.2*    TUMOR MARKERS: No results for input(s): AFPTM, CEA, CA199, CHROMGRNA in the last 8760 hours.  Assessment and Plan: Acute Cholecystitis Patient admitted with abdominal pain.  CT Abd/Pelvis and HIDA scan both suggest gallstone cholecystitis.  IR consulted for percutaneous drainage in patient who is at high risk for surgery.  She has been NPO.  She does not take blood thinners.  INR 1.16 Discussed procedure with daughter who asks appropriate questions about diagnosis, procedure, and merits of comfort care for her mother.  Daughter wants to pursue percutaneous drainage.  She is aware this will likely be indefinite.  Risks and benefits discussed with the patient including, but not limited to bleeding, infection, gallbladder perforation, bile leak, sepsis or even death. All of the patient's questions were answered, patient is agreeable to proceed. Consent signed and in chart. Anticipate procedure today.  Thank you for this interesting consult.  I greatly enjoyed meeting Khamia L Elwell and look forward to participating in their care.  A copy of this report was sent to the requesting provider on this date.  Electronically Signed: Hoyt Koch 06/14/2016, 9:23 AM   I spent a total of 40 Minutes    in face to face in clinical consultation, greater than 50% of which was counseling/coordinating care for acute cholecystitis.

## 2016-06-14 NOTE — Progress Notes (Signed)
Nutrition Follow-up  DOCUMENTATION CODES:   Not applicable  INTERVENTION:   -Boost Breeze po TID, each supplement provides 250 kcal and 9 grams of protein -RD will continue to follow for diet advancement and adjust supplement regimen as appropriate  NUTRITION DIAGNOSIS:   Inadequate oral intake related to altered GI function as evidenced by per patient/family report (NPO/CLD).  GOAL:   Patient will meet greater than or equal to 90% of their needs  MONITOR:   PO intake, Supplement acceptance, Diet advancement, Labs, Weight trends, Skin, I & O's  REASON FOR ASSESSMENT:   Malnutrition Screening Tool    ASSESSMENT:   Dana Tate is a 83 y.o. female who presents with advanced dementia and acute cholecystitis and a 4mm L ureter stone at the UPJ w/o hydronephrosis. Pt is not a good surgical candidate. Surgery following and appreciate their assistance. In addition to plan outlined below will add flomax and strain urine if possible  Pt admitted with acute cholecystitis.   Pt and family unavailable at time of visit Unable to obtain additional hx or complete Nutrition-Focused physical exam at this time.   Case discussed with RN, who reports that pt is down in IR for perc drain placement. Pt is not a candidate for lap chole. RN reports per discussion with pt daughter intake has been poor for the past 3 days PTA related to abdominal pain. Prior to acute illness, pt was at baseline. RN reports that pt will likely have diet advanced later today (ADDENDUM: Pt has been advanced to clear liquids).   Reviewed wt hx, which reveals progressive wt gain over the past several years.   Labs reviewed.   Diet Order:  Diet clear liquid Room service appropriate? Yes; Fluid consistency: Thin  Skin:  Reviewed, no issues  Last BM:  06/12/16  Height:   Ht Readings from Last 1 Encounters:  06/14/16 5' (1.524 m)    Weight:   Wt Readings from Last 1 Encounters:  06/14/16 101 lb (45.8 kg)     Ideal Body Weight:  45.5 kg  BMI:  Body mass index is 19.73 kg/m.  Estimated Nutritional Needs:   Kcal:  1100-1300  Protein:  45-60 grams  Fluid:  > 1.1 L  EDUCATION NEEDS:   Education needs addressed  Karuna Balducci A. Mayford Knife, RD, LDN, CDE Pager: [phone] After hours Pager: [phone]

## 2016-06-14 NOTE — Sedation Documentation (Signed)
O2 d/c'd, moved back to bed.

## 2016-06-14 NOTE — Sedation Documentation (Signed)
Patient is resting comfortably. 

## 2016-06-14 NOTE — Procedures (Signed)
Cholecystostomy 10 Fr Bilious fluid obtained for Cx EBL 0 Comp 0

## 2016-06-14 NOTE — Progress Notes (Signed)
PT Cancellation Note  Patient Details Name: Dana Tate MRN: [id number] DOB: 1922-07-31   Cancelled Treatment:    Reason Eval/Treat Not Completed: Patient at procedure or test/unavailable Pt not in room. RN reports patient down in interventional radiology. Will reattempt as schedule allows.   Margot Chimes, PT, DPT  Acute Rehabilitation Services  Pager: [phone]    Melvyn Novas 06/14/2016, 12:59 PM

## 2016-06-14 NOTE — Progress Notes (Signed)
Internal Medicine Attending:   I saw and examined the patient. I reviewed the resident's note and I agree with the resident's findings and plan as documented in the resident's note.  Patient is a 83 year old female with a past medical history of hypertension, hyperlipidemia, chronic pelvic pain, constipation and dementia who presented to the ED with a chief complaint of right upper quadrant abdominal pain over the last 4 days. She was treated conservatively with Tylenol and laxatives without improvement over the last couple of days and was sent to the ED for further eval. In the ED she was found to have a mild leukocytosis with a left shift and elevated LFTs and elevated lactic acid. CT abdomen and pelvis done at the time was concerning for acute cholecystitis and she was admitted for further workup. HIDA scan was done which was also consistent with acute cholecystitis. Patient was initially started on ceftriaxone and Flagyl and then transitioned to Zosyn. Surgery consult and recommendations appreciated. Patient is not a good candidate for cholecystectomy. She is now status post a cholecystostomy tube placement by IR. We'll follow cultures from cholecystostomy tube. Lactic acidosis is now resolved. On exam today she was noted to have tenderness to palpation in the right upper quadrant and epigastric region with hypoactive bowel sounds. We'll continue pain control for now. Case is discussed with daughter at bedside who expressed understanding and is in agreement with plan.

## 2016-06-15 LAB — CBC
HCT: 28.5 % — ABNORMAL LOW (ref 36.0–46.0)
HCT: 31.1 % — ABNORMAL LOW (ref 36.0–46.0)
HEMOGLOBIN: 9 g/dL — AB (ref 12.0–15.0)
HEMOGLOBIN: 9.8 g/dL — AB (ref 12.0–15.0)
MCH: 29.6 pg (ref 26.0–34.0)
MCH: 29.5 pg (ref 26.0–34.0)
MCHC: 31.6 g/dL (ref 30.0–36.0)
MCHC: 31.5 g/dL (ref 30.0–36.0)
MCV: 93.8 fL (ref 78.0–100.0)
MCV: 93.7 fL (ref 78.0–100.0)
Platelets: 221 10*3/uL (ref 150–400)
Platelets: 240 10*3/uL (ref 150–400)
RBC: 3.04 MIL/uL — ABNORMAL LOW (ref 3.87–5.11)
RBC: 3.32 MIL/uL — AB (ref 3.87–5.11)
RDW: 14.6 % (ref 11.5–15.5)
RDW: 14.7 % (ref 11.5–15.5)
WBC: 8.1 10*3/uL (ref 4.0–10.5)
WBC: 8.8 10*3/uL (ref 4.0–10.5)

## 2016-06-15 LAB — COMPREHENSIVE METABOLIC PANEL
ALK PHOS: 66 U/L (ref 38–126)
ALT: 45 U/L (ref 14–54)
ANION GAP: 7 (ref 5–15)
AST: 52 U/L — ABNORMAL HIGH (ref 15–41)
Albumin: 2 g/dL — ABNORMAL LOW (ref 3.5–5.0)
BUN: 21 mg/dL — ABNORMAL HIGH (ref 6–20)
CALCIUM: 7.5 mg/dL — AB (ref 8.9–10.3)
CO2: 17 mmol/L — ABNORMAL LOW (ref 22–32)
Chloride: 112 mmol/L — ABNORMAL HIGH (ref 101–111)
Creatinine, Ser: 1.35 mg/dL — ABNORMAL HIGH (ref 0.44–1.00)
GFR calc non Af Amer: 33 mL/min — ABNORMAL LOW (ref >60)
GFR, EST AFRICAN AMERICAN: 38 mL/min — AB (ref >60)
Glucose, Bld: 80 mg/dL (ref 65–99)
Potassium: 4 mmol/L (ref 3.5–5.1)
SODIUM: 136 mmol/L (ref 135–145)
TOTAL PROTEIN: 4.7 g/dL — AB (ref 6.5–8.1)
Total Bilirubin: 0.5 mg/dL (ref 0.3–1.2)

## 2016-06-15 MED ORDER — ENSURE ENLIVE PO LIQD
237.0000 mL | Freq: Three times a day (TID) | ORAL | Status: DC
  Administered 2016-06-15 – 2016-06-16 (×4): 237 mL via ORAL

## 2016-06-15 NOTE — Clinical Social Work Note (Signed)
Clinical Social Work Assessment  Patient Details  Name: Dana Tate MRN: [id number] Date of Birth: September 24, 1922  Date of referral:  06/15/16               Reason for consult:  Discharge Planning                Permission sought to share information with:  Family Supports, Customer service manager Permission granted to share information::  Yes, Verbal Permission Granted  Name::     Langston Masker  Agency::  [facility]  Relationship::  Daughter  Contact Information:  [phone]  Housing/Transportation Living arrangements for the past 2 months:  [place] [facility]) Source of Information:  Adult Children Patient Interpreter Needed:  None Criminal Activity/Legal Involvement Pertinent to Current Situation/Hospitalization:  No - Comment as needed Significant Relationships:  Adult Children, Other Family Members Lives with:  Facility Resident Do you feel safe going back to the place where you live?  Yes Need for family participation in patient care:  Yes (Comment)  Care giving concerns:  No caregiving concerns identified.    Social Worker assessment / plan:  CSW met with pt to address consult for new SNF. Dtr present. CSW introduced herself and explained role of social work. Pt from [facility] and will return upon discharge. Pt also a [facility] participant. P/T still to assess pt. Dtr reports dtr can no longer care for pt and she will remain in SNF. CSW will facilitate d/c back to [facility] when medically ready.    Employment status:  Retired Forensic scientist:  Other (Comment Required) ([facility]) PT Recommendations:  Not assessed at this time Information / Referral to community resources:  [place]  Patient/Family's Response to care:  Pt and daughter very appreciative of CSW support and guidance.   Patient/Family's Understanding of and Emotional Response to Diagnosis, Current Treatment, and Prognosis:  Pt and family understand and are  accepting of her diagnosis, current treatment plan, and prognosis.    Emotional Assessment Appearance:  Appears stated age Attitude/Demeanor/Rapport:  Other (Appropriate) Affect (typically observed):  Accepting, Pleasant Orientation:  Oriented to Self, Oriented to Place Alcohol / Substance use:  Other Psych involvement (Current and /or in the community):  No (Comment)  Discharge Needs  Concerns to be addressed:  Care Coordination Readmission within the last 30 days:  No Current discharge risk:  Dependent with Mobility Barriers to Discharge:  Continued Medical Work up   [employer], LCSW 06/15/2016, 7:43 AM

## 2016-06-15 NOTE — Progress Notes (Signed)
   Subjective: Patient was evaluated this morning on rounds. She complained of right-sided upper quadrant abdominal pain.  Objective:  Vital signs in last 24 hours: Vitals:   06/14/16 1352 06/14/16 1636 06/14/16 2021 06/15/16 0634  BP: (!) 149/61  (!) 150/66 126/88  Pulse: 79  88 88  Resp: Temp: 97.6 F (36.4 C)  97.3 F (36.3 C) 97.9 F (36.6 C)  TempSrc: Oral  Oral Oral  SpO2: 100%  96% 96%  Weight:  101 lb (45.8 kg)    Height:  5' (1.524 m)     Physical Exam  Constitutional:  Mild distress  Cardiovascular: Normal rate, regular rhythm and normal heart sounds.  Exam reveals no gallop and no friction rub.   No murmur heard. Pulmonary/Chest: Effort normal and breath sounds normal. No respiratory distress. She has no wheezes. She has no rales.  Abdominal: Soft. She exhibits no distension.  Tenderness in RUQ.  Drain in place with yellow opaque drainage    Assessment/Plan:  Principal Problem:   Acute cholecystitis Active Problems:   Dementia   Constipation   Hypertension   Chronic kidney disease   Ureterolithiasis   Pressure injury of skin  Acute Cholecystitis s/p percutaneous cholecystostomy  Patient had drain placed yesterday as she is not a candidate for cholecystectomy.  Patient is afebrile with no leukocytosis.  Patient reports pain in her right upper quadrant.  Drain in place with bilious drainage.   - Hydrocodone-acetaminophen 5-325mg  1-2 tablets QH4 PRN  - PT eval and treat - Will return to [facility] when medically cleared.   - advance diet as tolerated - Zosyn - surgical wound culture pending - per surgery abx and drain for 6 weeks  Microcytic anemia Patient's hemoglobin on admission 2 days ago was 12 and is currently 9.  Will get a repeat CBC.  No obvious signs of bleeding. -CBC - transfuse Hgb < 7  Lactic Acidosis Resolved  Hypertension Hypertensive on admission, now normotensive. Not on home antihypertensives. Received one dose of  hydralazine on admission. -Continue hydralazine 5-10 mg Q8H prn  Chronic Constipation Patient is on multple laxative and stool softeners at home.  -Continue senokot prn -Continue Dulcolax prn  Dispo: Anticipated discharge pending clinical improvement.   Camelia Phenes, DO 06/15/2016, 11:18 AM Pager: [phone]

## 2016-06-15 NOTE — Evaluation (Signed)
Physical Therapy Evaluation Patient Details Name: Dana Tate MRN: [id number] DOB: 11/15/1922 Today's Date: 06/15/2016   History of Present Illness  Pt is a 82 yo female admitted from SNF with c/o RUQ pain. Pt is s/p cholesytectomy tube placement 06/14/16. PMH significant for dementia, anxiety, dizziness, HTN and hard of hearing  Clinical Impression  Pt admitted with above diagnosis. Pt currently with functional limitations due to the deficits listed below (see PT Problem List). Pt modAx1 for bed mobility to EoB, maxAx1 for squat pivot transfer to<>from recliner. Pt able to initiate movements but then stops assist before reaching final destination. Pt dementia is limiting her ability to understand PT goals for mobility.  Pt will benefit from skilled PT to increase their independence and safety with mobility to allow discharge to the venue listed below.       Follow Up Recommendations SNF;Supervision/Assistance - 24 hour    Equipment Recommendations  None recommended by PT    Recommendations for Other Services OT consult     Precautions / Restrictions Precautions Precautions: Fall Restrictions Weight Bearing Restrictions: No      Mobility  Bed Mobility Overal bed mobility: Needs Assistance Bed Mobility: Supine to Sit;Sit to Supine     Supine to sit: Mod assist Sit to supine: Max assist;+2 for physical assistance   General bed mobility comments: pt able to aid in getting LE off of bed but with increased posterior lean requiring modAx1 to bring to upright  Transfers Overall transfer level: Needs assistance Equipment used: None Transfers: Squat Pivot Transfers     Squat pivot transfers: Max assist;Total assist     General transfer comment: pt motioned to sit over in recliner and able to reach over to recliner and initiate standing but then decreased assistance and requires maxAx1 after sitting in recliner for > 5 minutes pt began to make motions to sit up from recliner.  Pt required total A to get from recliner to bed.       Balance Overall balance assessment: Needs assistance Sitting-balance support: Bilateral upper extremity supported;Feet supported Sitting balance-Leahy Scale: Poor Sitting balance - Comments: pt require minAx1 for maintaining balance EoB Postural control: Posterior lean                                   Pertinent Vitals/Pain Pain Assessment: Faces Faces Pain Scale: Hurts a little bit Pain Location: abdomen Pain Descriptors / Indicators: Operative site guarding Pain Intervention(s): Monitored during session  VSS    Home Living Family/patient expects to be discharged to:: Skilled nursing facility                      Prior Function           Comments: pt unable to report     Hand Dominance        Extremity/Trunk Assessment   Upper Extremity Assessment Upper Extremity Assessment: Generalized weakness    Lower Extremity Assessment Lower Extremity Assessment: Generalized weakness    Cervical / Trunk Assessment Cervical / Trunk Assessment: Kyphotic  Communication   Communication: Receptive difficulties;Expressive difficulties;HOH  Cognition Arousal/Alertness: Lethargic Behavior During Therapy: Anxious Overall Cognitive Status: No family/caregiver present to determine baseline cognitive functioning                                 General Comments: pt only  responded "OK" to all questions       General Comments General comments (skin integrity, edema, etc.): Pt able to initiate movement but once she starts movement she stops her assistance.         Assessment/Plan    PT Assessment Patient needs continued PT services  PT Problem List Decreased strength;Decreased activity tolerance;Decreased balance;Decreased mobility;Decreased cognition;Decreased knowledge of use of DME;Decreased safety awareness       PT Treatment Interventions Functional mobility  training;Therapeutic activities;Therapeutic exercise;Balance training;Patient/family education;Cognitive remediation    PT Goals (Current goals can be found in the Care Plan section)  Acute Rehab PT Goals PT Goal Formulation: Patient unable to participate in goal setting Time For Goal Achievement: 06/29/16 Potential to Achieve Goals: Poor    Frequency Min 3X/week    End of Session Equipment Utilized During Treatment: Gait belt Activity Tolerance: Patient limited by lethargy Patient left: in bed;with call bell/phone within reach;with nursing/sitter in room Nurse Communication: Mobility status PT Visit Diagnosis: Unsteadiness on feet (R26.81);Other abnormalities of gait and mobility (R26.89);Other symptoms and signs involving the nervous system (R29.898);Pain;Muscle weakness (generalized) (M62.81) Pain - Right/Left:  (abdomen)    Time: 1610-9604 PT Time Calculation (min) (ACUTE ONLY): 26 min   Charges:   PT Evaluation $PT Eval Moderate Complexity: 1 Procedure PT Treatments $Therapeutic Activity: 8-22 mins   PT G Codes:        [name] B. Beverely Risen PT, DPT Acute Rehabilitation  [phone] Pager [phone]    Elon Alas [facility] 06/15/2016, 12:39 PM

## 2016-06-15 NOTE — Care Management Note (Signed)
Case Management Note  Patient Details  Name: Dana Tate MRN: [id number] Date of Birth: 29-May-1922  Subjective/Objective:  Pt is a 83 yo female admitted from SNF with c/o RUQ pain. Pt is s/p cholesytectomy tube placement 06/14/16.  PTA, pt resided at [facility]; PMH of dementia and anxiety.  She is followed by [facility].                    Action/Plan: CSW following to facilitate return to SNF upon medical stability.  Will follow/assist with dc planning as needed.    Expected Discharge Date:                  Expected Discharge Plan:  Skilled Nursing Facility  In-House Referral:  Clinical Social Work  Discharge planning Services  CM Consult  Post Acute Care Choice:    Choice offered to:     DME Arranged:    DME Agency:     HH Arranged:    HH Agency:     Status of Service:  In process, will continue to follow  If discussed at Long Length of Stay Meetings, dates discussed:    Additional Comments:  Quintella Baton, RN, BSN  Trauma/Neuro ICU Case Manager [phone]

## 2016-06-15 NOTE — Progress Notes (Signed)
Transitions of Care Pharmacy Note  Reviewed patient's chart re: current medication indications, dosing, frequency, and any notable side effects.  Plan:  Consider scheduling Senokot-S if clinically indicated, as patient takes nightly per PTA list.  No other notable findings on review of patients medications  --------------------------------------------- Dana Tate is an 83 y.o. female who presents with a chief complaint abd pain. In anticipation of discharge, pharmacy has reviewed this patient's prior to admission medication history, as well as current inpatient medications listed per the [facility].  Current medication indications, dosing, frequency, and notable side effects reviewed in chart. Per nursing staff, patient is not able to participate in discussion regarding medications and patient family is not at the bedside. Patient also has history of advanced dementia. Per notes, patient to return to SNF at discharge.   Time spent reviewing chart: 20 min   York Cerise, PharmD Pharmacy Resident  Pager [phone] 06/15/16 6:40 PM

## 2016-06-15 NOTE — Progress Notes (Signed)
Referring Physician(s): Thompson,B  Supervising Physician: Jolaine Click  Patient Status:  [facility] - In-pt  Chief Complaint:  cholecystitis  Subjective: Pt doing ok; being fed by daughter; moans when RUQ palpated   Allergies: Sulfa antibiotics; Orange (diagnostic); and Penicillins  Medications: Prior to Admission medications   Medication Sig Start Date End Date Taking? Authorizing Provider  acetaminophen (TYLENOL ARTHRITIS PAIN) 650 MG CR tablet Take 650 mg by mouth 3 (three) times daily as needed for pain.   Yes Historical Provider, MD  bisacodyl (DULCOLAX) 10 MG suppository Place 10 mg rectally daily as needed for moderate constipation.   Yes Historical Provider, MD  ENSURE (ENSURE) Take 237 mLs by mouth 3 (three) times daily.    Yes Historical Provider, MD  magnesium hydroxide (MILK OF MAGNESIA) 400 MG/5ML suspension Take 30 mLs by mouth daily as needed for mild constipation.   Yes Historical Provider, MD  Menthol, Topical Analgesic, (BIOFREEZE EX) Apply 1 application topically 2 (two) times daily as needed (pain). 3.5% GEL   Yes Historical Provider, MD  mirtazapine (REMERON) 15 MG tablet Take 7.5 mg by mouth at bedtime.    Yes Historical Provider, MD  NON FORMULARY Take 120 mLs by mouth 3 (three) times daily. MED PASS   Yes Historical Provider, MD  polyethylene glycol (MIRALAX / GLYCOLAX) packet Take 17 g by mouth daily as needed for mild constipation.   Yes Historical Provider, MD  senna-docusate (SENOKOT-S) 8.6-50 MG tablet Take 2 tablets by mouth at bedtime.   Yes Historical Provider, MD  Sodium Phosphates (RA SALINE ENEMA RE) Place 1 Applicatorful rectally daily as needed (constipatin).   Yes Historical Provider, MD     Vital Signs: BP (!) 137/53 (BP Location: Left Arm)   Pulse 70   Temp 98.2 F (36.8 C) (Oral)   Resp 19   Ht 5' (1.524 m)   Wt 161 lb 6.4 oz (73.2 kg) Comment: bedscale  SpO2 100%   BMI 31.52 kg/m   Physical Exam GB drain intact, insertion site  ok, mild-mod tenderness, output 50 cc green bile, no hemobilia; drain flushed without difficulty  Imaging: Nm Hepatobiliary Liver Func  Result Date: 06/13/2016 CLINICAL DATA:  83 y/o F; right upper quadrant pain, vomiting, and leukocytosis. EXAM: NUCLEAR MEDICINE HEPATOBILIARY IMAGING TECHNIQUE: Sequential images of the abdomen were obtained out to 60 minutes following intravenous administration of radiopharmaceutical. After administration of morphine 30 minutes and additional imaging was performed. RADIOPHARMACEUTICALS:  5.42 mCi Tc-22m Choletec IV, 1.8 mg morphine. COMPARISON:  06/13/2016 CT of the abdomen and pelvis. FINDINGS: Prompt uptake and biliary excretion of activity by the liver is seen. Intermittent reflux into the proximal duodenum/ gastric antrum. Free passage of tracer into the bowel indicating common bile duct patency. No appreciable radiotracer accumulation in gallbladder fossa indicating cystic duct obstruction. IMPRESSION: No appreciable radiotracer accumulation in the gallbladder indicating cystic duct obstruction compatible with acute cholecystitis in the appropriate clinical setting. These results will be called to the ordering clinician or representative by the Radiologist Assistant, and communication documented in the PACS or zVision Dashboard. Electronically Signed   By: Mitzi Hansen M.D.   On: 06/13/2016 15:48   Ct Abdomen Pelvis W Contrast  Result Date: 06/13/2016 CLINICAL DATA:  Abdominal pain EXAM: CT ABDOMEN AND PELVIS WITH CONTRAST TECHNIQUE: Multidetector CT imaging of the abdomen and pelvis was performed using the standard protocol following bolus administration of intravenous contrast. CONTRAST:  75mL ISOVUE-300 IOPAMIDOL (ISOVUE-300) INJECTION 61% COMPARISON:  CT abdomen pelvis 11/17/2015 FINDINGS:  Lower chest: There are bilateral small pleural effusions and associated atelectasis. There are coronary artery calcifications. Hepatobiliary: Trace perihepatic  ascites. There is mild gallbladder wall edema. There is contrast enhancement of the gallbladder fossa. Pancreas: Normal pancreatic contours and enhancement. No peripancreatic fluid collection or pancreatic ductal dilatation. Spleen: Normal. Adrenals/Urinary Tract: Normal adrenal glands. Severe right renal atrophy. There is a 4 mm stone within the proximal left ureter. No hydronephrosis or perinephric stranding. On the excretory phase images, however, excreted contrast does pass the level of the stone. Stomach/Bowel: No abnormal bowel dilatation. No bowel wall thickening or adjacent fat stranding to indicate acute inflammation. No abdominal fluid collection. Normal appendix. Small hiatal hernia. Vascular/Lymphatic: There is atherosclerotic calcification of the non aneurysmal abdominal aorta. No abdominal or pelvic adenopathy. Reproductive: Bilateral tubal ligation. Unremarkable uterus. No free fluid in the pelvis. Musculoskeletal: There is no bony spinal canal stenosis. No lytic or blastic lesions. Normal visualized extrathoracic and extraperitoneal soft tissues. Other: No contributory non-categorized findings. IMPRESSION: 1. Gallbladder wall edema and contrast enhancement of the gallbladder fossa, concerning for acute cholecystitis. Nuclear medicine hepatobiliary scan may be helpful for confirmation. 2. Left ureterolithiasis with 4 mm stone at the left ureteropelvic junction with no associated hydronephrosis. 3. Small bilateral pleural effusions and associated atelectasis. 4. Coronary artery and aortic atherosclerosis. Electronically Signed   By: Deatra Robinson M.D.   On: 06/13/2016 04:50   Ir Perc Cholecystostomy  Result Date: 06/14/2016 INDICATION: Acute cholecystitis EXAM: CHOLECYSTOSTOMY MEDICATIONS: None.  Patient is on Zosyn. ANESTHESIA/SEDATION: Fentanyl 50 mcg IV; Versed 1.5 mg IV Moderate Sedation Time:  15 minutes. The patient was continuously monitored during the procedure by the interventional  radiology nurse under my direct supervision. FLUOROSCOPY TIME:  Fluoroscopy Time: 1 minutes 18 seconds (3 mGy). COMPLICATIONS: None immediate. PROCEDURE: Informed written consent was obtained from the patient after a thorough discussion of the procedural risks, benefits and alternatives. All questions were addressed. Maximal Sterile Barrier Technique was utilized including caps, mask, sterile gowns, sterile gloves, sterile drape, hand hygiene and skin antiseptic. A timeout was performed prior to the initiation of the procedure. The right upper quadrant was prepped with Betadine in a sterile fashion, and a sterile drape was applied covering the operative field. A sterile gown and sterile gloves were used for the procedure. A 21 gauge needle was inserted into the gallbladder lumen under sonographic guidance and via transhepatic approach. It was removed over a 018 wire, which was upsized to a 3-J. A 10-French drain was advanced over the wire and coiled in the gallbladder lumen. It was sewn to the skin after being string fixed. FINDINGS: The cystic duct is occluded. Imaging documents 10 French drain placement coiled in the lumen of the gallbladder. IMPRESSION: Successful cholecystostomy. This needs to remain in place at least 6 weeks. Electronically Signed   By: Jolaine Click M.D.   On: 06/14/2016 13:26    Labs:  CBC:  Recent Labs  06/13/16 0202 06/14/16 0807 06/15/16 0243 06/15/16 1206  WBC 11.4* 8.1 8.1 8.8  HGB 12.4 10.5* 9.0* 9.8*  HCT 38.4 32.8* 28.5* 31.1*  PLT 227 205 221 240    COAGS:  Recent Labs  06/14/16 0807  INR 1.16    BMP:  Recent Labs  08/31/15 0034 06/13/16 0202 06/14/16 0807 06/15/16 0243  NA 139 140 141 136  K 4.0 4.1 4.2 4.0  CL 107 104 112* 112*  CO2 26 24 18* 17*  GLUCOSE 94 144* 75 80  BUN 22* 36*  22* 21*  CALCIUM 9.4 8.8* 7.7* 7.5*  CREATININE 1.32* 1.40* 1.33* 1.35*  GFRNONAA 34* 32* 34* 33*  GFRAA 40* 37* 39* 38*    LIVER FUNCTION TESTS:  Recent  Labs  06/13/16 0202 06/14/16 0807 06/15/16 0243  BILITOT 0.8 0.6 0.5  AST 196* 100* 52*  ALT 75* 65* 45  ALKPHOS 100 82 66  PROT 6.8 5.2* 4.7*  ALBUMIN 3.1* 2.2* 2.0*    Assessment and Plan: Acute cholecystitis, s/p perc cholecystostomy 4/17; AF; WBC nl; hgb 9.8; creat 1.35, t bili 0.5; bile cx pending; cont drain irrigation with sterile NS, antbx per primary team; drain will need to remain in place for at least 4-6 weeks unless cholecystectomy done in interim   Electronically Signed: D. Jeananne Rama 06/15/2016, 2:26 PM   I spent a total of 15 minutes at the the patient's bedside AND on the patient's hospital floor or unit, greater than 50% of which was counseling/coordinating care for gallbladder drain    Patient ID: Dana Tate, female   DOB: 1922-12-29, 83 y.o.   MRN: [id number]

## 2016-06-15 NOTE — Progress Notes (Signed)
Internal Medicine Attending:   I saw and examined the patient. I reviewed the resident's note and I agree with the resident's findings and plan as documented in the resident's note.  Patient complaining of right-sided abdominal pain this morning. She is now status post a percutaneous cholecystostomy tube placement for her acute cholecystitis. Surgery follow-up and recommendations appreciated. Patient will need drained to be in place for at least 6 weeks. Continue pain control for now. Continue with Zosyn for now. We will follow cultures from the OR. Blood cultures remain negative to date. Patient also noted to have worsening hemoglobin today. She has no signs of an active bleed. We will obtain repeat CBC today. If her hemoglobin continues to drop with consider CT abdomen and pelvis to rule out bleed post surgery.

## 2016-06-15 NOTE — Progress Notes (Signed)
Assessment Principal Problem:   Acute cholecystitis s/p percutaneous cholecystostomy-bilious drain output, culture pending. Active Problems:   Dementia   Constipation   Hypertension   Chronic kidney disease   Ureterolithiasis   Pressure injury of skin   Plan:  Continue abxs and drain (6 weeks).   LOS: 81 days        Chief Complaint/Subjective: Moans when aroused  Objective: Vital signs in last 24 hours: Temp:  [97.3 F (36.3 C)-97.9 F (36.6 C)] 97.9 F (36.6 C) (04/18 0634) Pulse Rate:  [79-102] 88 (04/18 0634) Resp:  [14-30] 19 (04/18 0634) BP: (126-171)/(61-99) 126/88 (04/18 0634) SpO2:  [92 %-100 %] 96 % (04/18 0634) Weight:  [45.8 kg (101 lb)] 45.8 kg (101 lb) (04/17 1636) Last BM Date: 06/12/16  Intake/Output from previous day: 04/17 0701 - 04/18 0700 In: 360 [P.O.:90; I.V.:110; IV Piggyback:150] Out: 440 [Urine:100; Drains:340] Intake/Output this shift: No intake/output data recorded.  PE: General- In NAD.  Awake and alert. Abdomen-soft, drain in RUQ  Lab Results:   Recent Labs  06/14/16 0807 06/15/16 0243  WBC 8.1 8.1  HGB 10.5* 9.0*  HCT 32.8* 28.5*  PLT 205 221   BMET  Recent Labs  06/14/16 0807 06/15/16 0243  NA 141 136  K 4.2 4.0  CL 112* 112*  CO2 18* 17*  GLUCOSE 75 80  BUN 22* 21*  CREATININE 1.33* 1.35*  CALCIUM 7.7* 7.5*   PT/INR  Recent Labs  06/14/16 0807  LABPROT 14.9  INR 1.16   Comprehensive Metabolic Panel:    Component Value Date/Time   NA 136 06/15/2016 0243   NA 141 06/14/2016 0807   K 4.0 06/15/2016 0243   K 4.2 06/14/2016 0807   CL 112 (H) 06/15/2016 0243   CL 112 (H) 06/14/2016 0807   CO2 17 (L) 06/15/2016 0243   CO2 18 (L) 06/14/2016 0807   BUN 21 (H) 06/15/2016 0243   BUN 22 (H) 06/14/2016 0807   CREATININE 1.35 (H) 06/15/2016 0243   CREATININE 1.33 (H) 06/14/2016 0807   GLUCOSE 80 06/15/2016 0243   GLUCOSE 75 06/14/2016 0807   CALCIUM 7.5 (L) 06/15/2016 0243   CALCIUM 7.7 (L) 06/14/2016  0807   AST 52 (H) 06/15/2016 0243   AST 100 (H) 06/14/2016 0807   ALT 45 06/15/2016 0243   ALT 65 (H) 06/14/2016 0807   ALKPHOS 66 06/15/2016 0243   ALKPHOS 82 06/14/2016 0807   BILITOT 0.5 06/15/2016 0243   BILITOT 0.6 06/14/2016 0807   PROT 4.7 (L) 06/15/2016 0243   PROT 5.2 (L) 06/14/2016 0807   ALBUMIN 2.0 (L) 06/15/2016 0243   ALBUMIN 2.2 (L) 06/14/2016 0807     Studies/Results: Nm Hepatobiliary Liver Func  Result Date: 06/13/2016 CLINICAL DATA:  83 y/o F; right upper quadrant pain, vomiting, and leukocytosis. EXAM: NUCLEAR MEDICINE HEPATOBILIARY IMAGING TECHNIQUE: Sequential images of the abdomen were obtained out to 60 minutes following intravenous administration of radiopharmaceutical. After administration of morphine 30 minutes and additional imaging was performed. RADIOPHARMACEUTICALS:  5.42 mCi Tc-54m Choletec IV, 1.8 mg morphine. COMPARISON:  06/13/2016 CT of the abdomen and pelvis. FINDINGS: Prompt uptake and biliary excretion of activity by the liver is seen. Intermittent reflux into the proximal duodenum/ gastric antrum. Free passage of tracer into the bowel indicating common bile duct patency. No appreciable radiotracer accumulation in gallbladder fossa indicating cystic duct obstruction. IMPRESSION: No appreciable radiotracer accumulation in the gallbladder indicating cystic duct obstruction compatible with acute cholecystitis in the appropriate clinical setting. These results  will be called to the ordering clinician or representative by the Radiologist Assistant, and communication documented in the PACS or zVision Dashboard. Electronically Signed   By: Mitzi Hansen M.D.   On: 06/13/2016 15:48   Ir Perc Cholecystostomy  Result Date: 06/14/2016 INDICATION: Acute cholecystitis EXAM: CHOLECYSTOSTOMY MEDICATIONS: None.  Patient is on Zosyn. ANESTHESIA/SEDATION: Fentanyl 50 mcg IV; Versed 1.5 mg IV Moderate Sedation Time:  15 minutes. The patient was continuously  monitored during the procedure by the interventional radiology nurse under my direct supervision. FLUOROSCOPY TIME:  Fluoroscopy Time: 1 minutes 18 seconds (3 mGy). COMPLICATIONS: None immediate. PROCEDURE: Informed written consent was obtained from the patient after a thorough discussion of the procedural risks, benefits and alternatives. All questions were addressed. Maximal Sterile Barrier Technique was utilized including caps, mask, sterile gowns, sterile gloves, sterile drape, hand hygiene and skin antiseptic. A timeout was performed prior to the initiation of the procedure. The right upper quadrant was prepped with Betadine in a sterile fashion, and a sterile drape was applied covering the operative field. A sterile gown and sterile gloves were used for the procedure. A 21 gauge needle was inserted into the gallbladder lumen under sonographic guidance and via transhepatic approach. It was removed over a 018 wire, which was upsized to a 3-J. A 10-French drain was advanced over the wire and coiled in the gallbladder lumen. It was sewn to the skin after being string fixed. FINDINGS: The cystic duct is occluded. Imaging documents 10 French drain placement coiled in the lumen of the gallbladder. IMPRESSION: Successful cholecystostomy. This needs to remain in place at least 6 weeks. Electronically Signed   By: Jolaine Click M.D.   On: 06/14/2016 13:26    Anti-infectives: Anti-infectives    Start     Dose/Rate Route Frequency Ordered Stop   06/13/16 0800  piperacillin-tazobactam (ZOSYN) IVPB 2.25 g     2.25 g 100 mL/hr over 30 Minutes Intravenous Every 8 hours 06/13/16 0732     06/13/16 0500  cefTRIAXone (ROCEPHIN) 1 g in dextrose 5 % 50 mL IVPB     1 g 100 mL/hr over 30 Minutes Intravenous  Once 06/13/16 0458 06/13/16 0723   06/13/16 0500  metroNIDAZOLE (FLAGYL) IVPB 500 mg     500 mg 100 mL/hr over 60 Minutes Intravenous  Once 06/13/16 0458 06/13/16 0723       Suleman Gunning J 06/15/2016

## 2016-06-15 NOTE — Progress Notes (Signed)
Nutrition Follow-up  DOCUMENTATION CODES:   Not applicable  INTERVENTION:   -Ensure Enlive po TID, each supplement provides 350 kcal and 20 grams of protein  NUTRITION DIAGNOSIS:   Inadequate oral intake related to altered GI function as evidenced by per patient/family report (NPO/CLD).  Ongoing  GOAL:   Patient will meet greater than or equal to 90% of their needs  Unmet  MONITOR:   PO intake, Supplement acceptance, Diet advancement, Labs, Weight trends, Skin, I & O's  REASON FOR ASSESSMENT:   Malnutrition Screening Tool    ASSESSMENT:   Dana Tate is a 83 y.o. female who presents with advanced dementia and acute cholecystitis and a 4mm L ureter stone at the UPJ w/o hydronephrosis. Pt is not a good surgical candidate. Surgery following and appreciate their assistance. In addition to plan outlined below will add flomax and strain urine if possible  Pt lying in bed, but refused to answer this RD's questions.   Case discussed with RN, who reports pt did not like the clear liquid diet this AM. Meal tray unattempted. Pt did accept medications in applesauce. Pt was advanced to a soft diet for lunch.   Reviewed records from [facility]- was on a vegetarian diet (with no ice beef, and oj). Pt also received 120 ml Medpass 2.0 TID and Ensure TID.   Nutrition-Focused physical exam completed. Findings are no fat depletion, no muscle depletion, and no edema.   Wt verified on bedscale- 161.4#.   Labs reviewed.   Diet Order:  DIET SOFT Room service appropriate? Yes; Fluid consistency: Thin  Skin:  Reviewed, no issues  Last BM:  06/12/16  Height:   Ht Readings from Last 1 Encounters:  06/14/16 5' (1.524 m)    Weight:   Wt Readings from Last 1 Encounters:  06/15/16 161 lb 6.4 oz (73.2 kg)    Ideal Body Weight:  45.5 kg  BMI:  Body mass index is 31.52 kg/m.  Estimated Nutritional Needs:   Kcal:  1400-1600  Protein:  65-80 grams  Fluid:  1.4-1.6  L  EDUCATION NEEDS:   Education needs addressed  Nohelani Benning A. Mayford Knife, RD, LDN, CDE Pager: [phone] After hours Pager: [phone]

## 2016-06-15 NOTE — Discharge Summary (Signed)
Name: Dana Tate MRN: [id number] DOB: [birth year] 83 y.o. PCP: Jethro Bastos, MD  Date of Admission: 06/13/2016 12:34 AM Date of Discharge: 06/16/2016 Attending Physician: Burns Spain, MD  Discharge Diagnosis: 1. Acute cholecystitis Principal Problem:   Acute cholecystitis Active Problems:   Dementia   Constipation   Hypertension   Chronic kidney disease   Ureterolithiasis   Pressure injury of skin   Discharge Medications: Allergies as of 06/16/2016      Reactions   Sulfa Antibiotics Itching, Rash   Orange (diagnostic) Other (See Comments)   On MAR   Penicillins Nausea Only, Other (See Comments)      Medication List    TAKE these medications   amoxicillin-clavulanate 500-125 MG tablet Commonly known as:  AUGMENTIN Take 1 tablet (500 mg total) by mouth 2 (two) times daily.   BIOFREEZE EX Apply 1 application topically 2 (two) times daily as needed (pain). 3.5% GEL   bisacodyl 10 MG suppository Commonly known as:  DULCOLAX Place 10 mg rectally daily as needed for moderate constipation.   ENSURE Take 237 mLs by mouth 3 (three) times daily.   HYDROcodone-acetaminophen 5-325 MG tablet Commonly known as:  NORCO/VICODIN Take 1 tablet by mouth every 6 (six) hours as needed for severe pain.   magnesium hydroxide 400 MG/5ML suspension Commonly known as:  MILK OF MAGNESIA Take 30 mLs by mouth daily as needed for mild constipation.   mirtazapine 15 MG tablet Commonly known as:  REMERON Take 7.5 mg by mouth at bedtime.   NON FORMULARY Take 120 mLs by mouth 3 (three) times daily. MED PASS   polyethylene glycol packet Commonly known as:  MIRALAX / GLYCOLAX Take 17 g by mouth daily as needed for mild constipation.   RA SALINE ENEMA RE Place 1 Applicatorful rectally daily as needed (constipatin).   senna-docusate 8.6-50 MG tablet Commonly known as:  Senokot-S Take 2 tablets by mouth at bedtime.   TYLENOL ARTHRITIS PAIN 650 MG CR tablet Generic  drug:  acetaminophen Take 650 mg by mouth 3 (three) times daily as needed for pain.       Disposition and follow-up:   Ms.Laticha L Panas was discharged from [facility] in good condition.  At the hospital follow up visit please address:  1.  Finish 1 day of Augmentin after discharge.  Assess drain output. Will need drain injection to assess for patency of cystic duct.  2.  Labs / imaging needed at time of follow-up: CBC- hemoglobin was low but stable during admission  3.  Pending labs/ test needing follow-up: Surgical drain culture and blood culture final report pending prior to discharge  Follow-up Appointments:   Hospital Course by problem list: Principal Problem:   Acute cholecystitis Active Problems:   Dementia   Constipation   Hypertension   Chronic kidney disease   Ureterolithiasis   Pressure injury of skin   1. Acute cholecystitis Patient was brought in by EMS from [facility] assisted living for a two day history of right upper quadrant abdominal pain, worse after eating. Patient was afebrile with mild leukocytosis of 11 that resolved during admission. AST and ALT were elevated and returned to normal limits during stay. Alkaline phosphatase and T bilirubin were normal. CT abdomen and pelvis showed gallbladder wall edema concerning for acute cholecystitis.  Subsequently HIDA scan confirmed acute cholecystitis given the clinical picture.  Patient was not considered a surgical candidate and a percutaneous cholecystostomy was placed on 4/17 to remain for  6 weeks and to be re-evaluated outpatient by general surgery. Patient was given zosyn, ceftriaxone and metronidazole on admission and later transitioned to just zosyn.  Hydrocodone-acetaminophen 5-325mg  was given to help control pain.  Wound culture was sent and showed no growth to date for two days prior to discharge.  Blood cultures showed no growth to date for 3 days prior to discharge.  On day of discharge  patient was transitioned to oral antibiotics and discharged with one more day to complete a five day total course of antibiotics.  2.  Microcytic anemia Patient's hemoglobin on admission was 12 and dropped to 9 during her stay. No signs of bleeding were noted. This was likely dilutional from fluids given. Patient's hemoglobin remained stable and improved slightly to 9.6 on discharge.  3. Lactic Acidosis Patient had a lactic acid of 2.5 on admission that resolved after IV fluids were given.   4. Hypertension Hypertensive on admission.  Not on home antihypertensives. Blood pressures were labile throughout admission.  She received hydralazine prn for elevated blood pressures.   5. Chronic Constipation Patient is on multple laxative and stool softeners at home. Senokot and dulcolax were continued.    Discharge Vitals:   BP (!) 141/71 (BP Location: Left Arm)   Pulse 66   Temp 98.6 F (37 C) (Oral)   Resp 16   Ht 5' (1.524 m)   Wt 161 lb 11.2 oz (73.3 kg)   SpO2 99%   BMI 31.58 kg/m   Pertinent Labs, Studies, and Procedures:  percutaneous cholecystostomy > 4/17  Discharge Instructions: Discharge Instructions    Diet - low sodium heart healthy    Complete by:  As directed    Discharge instructions    Complete by:  As directed    Ms. Lumm,  Please finish 1 more day of your antibiotics. He will need to follow-up with surgery outpatient to assess your drain. It will need to be in place for up to 6 weeks.   Increase activity slowly    Complete by:  As directed       Signed: Camelia Phenes, DO 06/16/2016, 12:40 PM   Pager: [phone]

## 2016-06-16 DIAGNOSIS — D509 Iron deficiency anemia, unspecified: Secondary | ICD-10-CM

## 2016-06-16 DIAGNOSIS — F0390 Unspecified dementia without behavioral disturbance: Secondary | ICD-10-CM

## 2016-06-16 DIAGNOSIS — I129 Hypertensive chronic kidney disease with stage 1 through stage 4 chronic kidney disease, or unspecified chronic kidney disease: Secondary | ICD-10-CM

## 2016-06-16 DIAGNOSIS — E872 Acidosis: Secondary | ICD-10-CM

## 2016-06-16 DIAGNOSIS — Z880 Allergy status to penicillin: Secondary | ICD-10-CM

## 2016-06-16 DIAGNOSIS — Z882 Allergy status to sulfonamides status: Secondary | ICD-10-CM

## 2016-06-16 DIAGNOSIS — K5909 Other constipation: Secondary | ICD-10-CM

## 2016-06-16 DIAGNOSIS — Z91041 Radiographic dye allergy status: Secondary | ICD-10-CM

## 2016-06-16 DIAGNOSIS — N189 Chronic kidney disease, unspecified: Secondary | ICD-10-CM

## 2016-06-16 LAB — BASIC METABOLIC PANEL
ANION GAP: 5 (ref 5–15)
BUN: 21 mg/dL — ABNORMAL HIGH (ref 6–20)
CHLORIDE: 115 mmol/L — AB (ref 101–111)
CO2: 19 mmol/L — AB (ref 22–32)
Calcium: 7.8 mg/dL — ABNORMAL LOW (ref 8.9–10.3)
Creatinine, Ser: 1.4 mg/dL — ABNORMAL HIGH (ref 0.44–1.00)
GFR calc non Af Amer: 32 mL/min — ABNORMAL LOW (ref >60)
GFR, EST AFRICAN AMERICAN: 37 mL/min — AB (ref >60)
GLUCOSE: 83 mg/dL (ref 65–99)
POTASSIUM: 4.3 mmol/L (ref 3.5–5.1)
Sodium: 139 mmol/L (ref 135–145)

## 2016-06-16 LAB — HEPATIC FUNCTION PANEL
ALT: 31 U/L (ref 14–54)
AST: 35 U/L (ref 15–41)
Albumin: 1.9 g/dL — ABNORMAL LOW (ref 3.5–5.0)
Alkaline Phosphatase: 62 U/L (ref 38–126)
BILIRUBIN TOTAL: 0.4 mg/dL (ref 0.3–1.2)
Bilirubin, Direct: <0.1 mg/dL — ABNORMAL LOW (ref 0.1–0.5)
TOTAL PROTEIN: 4.8 g/dL — AB (ref 6.5–8.1)

## 2016-06-16 LAB — CBC
HEMATOCRIT: 30.4 % — AB (ref 36.0–46.0)
HEMOGLOBIN: 9.6 g/dL — AB (ref 12.0–15.0)
MCH: 29.5 pg (ref 26.0–34.0)
MCHC: 31.6 g/dL (ref 30.0–36.0)
MCV: 93.5 fL (ref 78.0–100.0)
Platelets: 232 10*3/uL (ref 150–400)
RBC: 3.25 MIL/uL — AB (ref 3.87–5.11)
RDW: 14.6 % (ref 11.5–15.5)
WBC: 7.6 10*3/uL (ref 4.0–10.5)

## 2016-06-16 MED ORDER — HYDROCODONE-ACETAMINOPHEN 5-325 MG PO TABS: 1.0000 | ORAL_TABLET | Freq: Four times a day (QID) | ORAL | 0 refills | Status: AC | PRN

## 2016-06-16 MED ORDER — AMOXICILLIN-POT CLAVULANATE 500-125 MG PO TABS: 1.0000 | ORAL_TABLET | Freq: Two times a day (BID) | ORAL | 0 refills | Status: AC

## 2016-06-16 MED ORDER — AMOXICILLIN-POT CLAVULANATE 875-125 MG PO TABS: 1.0000 | ORAL_TABLET | Freq: Two times a day (BID) | ORAL | Status: DC

## 2016-06-16 MED ORDER — AMOXICILLIN-POT CLAVULANATE 500-125 MG PO TABS
1.0000 | ORAL_TABLET | Freq: Two times a day (BID) | ORAL | Status: DC
  Filled 2016-06-16 (×3): qty 1

## 2016-06-16 NOTE — Progress Notes (Signed)
Discharge to [facility]. Report was given by previous nurse. Patient was transported by [facility].

## 2016-06-16 NOTE — Care Management Note (Signed)
Case Management Note  Patient Details  Name: Dana Tate MRN: [id number] Date of Birth: 1922-12-25  Subjective/Objective:                    Action/Plan:   Expected Discharge Date:  06/16/16               Expected Discharge Plan:  Skilled Nursing Facility  In-House Referral:  Clinical Social Work  Discharge planning Services  CM Consult  Post Acute Care Choice:    Choice offered to:     DME Arranged:    DME Agency:     HH Arranged:    HH Agency:     Status of Service:  In process, will continue to follow  If discussed at Long Length of Stay Meetings, dates discussed:    Additional Comments:  Kingsley Plan, RN 06/16/2016, 2:32 PM

## 2016-06-16 NOTE — Progress Notes (Signed)
Referring Physician(s): Violeta Gelinas  Supervising Physician: Jolaine Click  Patient Status:  [facility] - In-pt  Chief Complaint:  Acute cholecystitis S/P Perc Drain by Dr. Bonnielee Haff 06/14/2016  Subjective:  Patient seen. Was sleeping then when I moved her covers she screamed out in pain, then upon moving her gown to examine her abdomen, she screamed in pain again. Student nurse in the room stated she is moaning all the time. Difficult to assess pain. She is tolerating PO.   Allergies: Sulfa antibiotics; Orange (diagnostic); and Penicillins  Medications: Prior to Admission medications   Medication Sig Start Date End Date Taking? Authorizing Provider  acetaminophen (TYLENOL ARTHRITIS PAIN) 650 MG CR tablet Take 650 mg by mouth 3 (three) times daily as needed for pain.   Yes Historical Provider, MD  bisacodyl (DULCOLAX) 10 MG suppository Place 10 mg rectally daily as needed for moderate constipation.   Yes Historical Provider, MD  ENSURE (ENSURE) Take 237 mLs by mouth 3 (three) times daily.    Yes Historical Provider, MD  magnesium hydroxide (MILK OF MAGNESIA) 400 MG/5ML suspension Take 30 mLs by mouth daily as needed for mild constipation.   Yes Historical Provider, MD  Menthol, Topical Analgesic, (BIOFREEZE EX) Apply 1 application topically 2 (two) times daily as needed (pain). 3.5% GEL   Yes Historical Provider, MD  mirtazapine (REMERON) 15 MG tablet Take 7.5 mg by mouth at bedtime.    Yes Historical Provider, MD  NON FORMULARY Take 120 mLs by mouth 3 (three) times daily. MED PASS   Yes Historical Provider, MD  polyethylene glycol (MIRALAX / GLYCOLAX) packet Take 17 g by mouth daily as needed for mild constipation.   Yes Historical Provider, MD  senna-docusate (SENOKOT-S) 8.6-50 MG tablet Take 2 tablets by mouth at bedtime.   Yes Historical Provider, MD  Sodium Phosphates (RA SALINE ENEMA RE) Place 1 Applicatorful rectally daily as needed (constipatin).   Yes Historical Provider, MD      Vital Signs: BP (!) 133/54 (BP Location: Left Arm)   Pulse 70   Temp 97.5 F (36.4 C) (Oral)   Resp 18   Ht 5' (1.524 m)   Wt 161 lb 11.2 oz (73.3 kg)   SpO2 100%   BMI 31.58 kg/m   Physical Exam Awakens easily and screams and moans Drain in place Dressing clean and drain in place [place] thin liquid in gravity bag, no purulence seen ~100 mL output  Imaging: Nm Hepatobiliary Liver Func  Result Date: 06/13/2016 CLINICAL DATA:  83 y/o F; right upper quadrant pain, vomiting, and leukocytosis. EXAM: NUCLEAR MEDICINE HEPATOBILIARY IMAGING TECHNIQUE: Sequential images of the abdomen were obtained out to 60 minutes following intravenous administration of radiopharmaceutical. After administration of morphine 30 minutes and additional imaging was performed. RADIOPHARMACEUTICALS:  5.42 mCi Tc-68m Choletec IV, 1.8 mg morphine. COMPARISON:  06/13/2016 CT of the abdomen and pelvis. FINDINGS: Prompt uptake and biliary excretion of activity by the liver is seen. Intermittent reflux into the proximal duodenum/ gastric antrum. Free passage of tracer into the bowel indicating common bile duct patency. No appreciable radiotracer accumulation in gallbladder fossa indicating cystic duct obstruction. IMPRESSION: No appreciable radiotracer accumulation in the gallbladder indicating cystic duct obstruction compatible with acute cholecystitis in the appropriate clinical setting. These results will be called to the ordering clinician or representative by the Radiologist Assistant, and communication documented in the PACS or zVision Dashboard. Electronically Signed   By: Mitzi Hansen M.D.   On: 06/13/2016 15:48   Ct Abdomen  Pelvis W Contrast  Result Date: 06/13/2016 CLINICAL DATA:  Abdominal pain EXAM: CT ABDOMEN AND PELVIS WITH CONTRAST TECHNIQUE: Multidetector CT imaging of the abdomen and pelvis was performed using the standard protocol following bolus administration of intravenous contrast.  CONTRAST:  75mL ISOVUE-300 IOPAMIDOL (ISOVUE-300) INJECTION 61% COMPARISON:  CT abdomen pelvis 11/17/2015 FINDINGS: Lower chest: There are bilateral small pleural effusions and associated atelectasis. There are coronary artery calcifications. Hepatobiliary: Trace perihepatic ascites. There is mild gallbladder wall edema. There is contrast enhancement of the gallbladder fossa. Pancreas: Normal pancreatic contours and enhancement. No peripancreatic fluid collection or pancreatic ductal dilatation. Spleen: Normal. Adrenals/Urinary Tract: Normal adrenal glands. Severe right renal atrophy. There is a 4 mm stone within the proximal left ureter. No hydronephrosis or perinephric stranding. On the excretory phase images, however, excreted contrast does pass the level of the stone. Stomach/Bowel: No abnormal bowel dilatation. No bowel wall thickening or adjacent fat stranding to indicate acute inflammation. No abdominal fluid collection. Normal appendix. Small hiatal hernia. Vascular/Lymphatic: There is atherosclerotic calcification of the non aneurysmal abdominal aorta. No abdominal or pelvic adenopathy. Reproductive: Bilateral tubal ligation. Unremarkable uterus. No free fluid in the pelvis. Musculoskeletal: There is no bony spinal canal stenosis. No lytic or blastic lesions. Normal visualized extrathoracic and extraperitoneal soft tissues. Other: No contributory non-categorized findings. IMPRESSION: 1. Gallbladder wall edema and contrast enhancement of the gallbladder fossa, concerning for acute cholecystitis. Nuclear medicine hepatobiliary scan may be helpful for confirmation. 2. Left ureterolithiasis with 4 mm stone at the left ureteropelvic junction with no associated hydronephrosis. 3. Small bilateral pleural effusions and associated atelectasis. 4. Coronary artery and aortic atherosclerosis. Electronically Signed   By: Deatra Robinson M.D.   On: 06/13/2016 04:50   Ir Perc Cholecystostomy  Result Date:  06/14/2016 INDICATION: Acute cholecystitis EXAM: CHOLECYSTOSTOMY MEDICATIONS: None.  Patient is on Zosyn. ANESTHESIA/SEDATION: Fentanyl 50 mcg IV; Versed 1.5 mg IV Moderate Sedation Time:  15 minutes. The patient was continuously monitored during the procedure by the interventional radiology nurse under my direct supervision. FLUOROSCOPY TIME:  Fluoroscopy Time: 1 minutes 18 seconds (3 mGy). COMPLICATIONS: None immediate. PROCEDURE: Informed written consent was obtained from the patient after a thorough discussion of the procedural risks, benefits and alternatives. All questions were addressed. Maximal Sterile Barrier Technique was utilized including caps, mask, sterile gowns, sterile gloves, sterile drape, hand hygiene and skin antiseptic. A timeout was performed prior to the initiation of the procedure. The right upper quadrant was prepped with Betadine in a sterile fashion, and a sterile drape was applied covering the operative field. A sterile gown and sterile gloves were used for the procedure. A 21 gauge needle was inserted into the gallbladder lumen under sonographic guidance and via transhepatic approach. It was removed over a 018 wire, which was upsized to a 3-J. A 10-French drain was advanced over the wire and coiled in the gallbladder lumen. It was sewn to the skin after being string fixed. FINDINGS: The cystic duct is occluded. Imaging documents 10 French drain placement coiled in the lumen of the gallbladder. IMPRESSION: Successful cholecystostomy. This needs to remain in place at least 6 weeks. Electronically Signed   By: Jolaine Click M.D.   On: 06/14/2016 13:26    Labs:  CBC:  Recent Labs  06/14/16 0807 06/15/16 0243 06/15/16 1206 06/16/16 0749  WBC 8.1 8.1 8.8 7.6  HGB 10.5* 9.0* 9.8* 9.6*  HCT 32.8* 28.5* 31.1* 30.4*  PLT 205 221 240 232    COAGS:  Recent Labs  06/14/16 0807  INR 1.16    BMP:  Recent Labs  06/13/16 0202 06/14/16 0807 06/15/16 0243 06/16/16 0749  NA  140 141 136 139  K 4.1 4.2 4.0 4.3  CL 104 112* 112* 115*  CO2 24 18* 17* 19*  GLUCOSE 144* 75 80 83  BUN 36* 22* 21* 21*  CALCIUM 8.8* 7.7* 7.5* 7.8*  CREATININE 1.40* 1.33* 1.35* 1.40*  GFRNONAA 32* 34* 33* 32*  GFRAA 37* 39* 38* 37*    LIVER FUNCTION TESTS:  Recent Labs  06/13/16 0202 06/14/16 0807 06/15/16 0243 06/16/16 0744  BILITOT 0.8 0.6 0.5 0.4  AST 196* 100* 52* 35  ALT 75* 65* 45 31  ALKPHOS 100 82 66 62  PROT 6.8 5.2* 4.7* 4.8*  ALBUMIN 3.1* 2.2* 2.0* 1.9*    Assessment and Plan:  Acute cholecysitis  S/P perc chole drain yesterday by Dr. Bonnielee Haff.  Continue current care and pain control  Antibiotics and drain x 6 weeks  Will need drain injection in our clinic to assess for patency of the cystic duct.  Given advanced age, unsure patient will ever be a candidate for cholecystectomy - this is up to Gen Surg.  At drain clinic follow up, If duct is clear, consider capping drain vs removing it.  No stones seen on CT so hopefully patient will not need to keep drain lifelong.  Drain plan will be per General Surgery.  Electronically Signed: Gwynneth Macleod PA-C 06/16/2016, 9:50 AM   I spent a total of 15 Minutes at the the patient's bedside AND on the patient's hospital floor or unit, greater than 50% of which was counseling/coordinating care for per chole

## 2016-06-16 NOTE — NC FL2 (Signed)
[place] MEDICAID FL2 LEVEL OF CARE SCREENING TOOL     IDENTIFICATION  Patient Name: Dana Tate Birthdate: 1922-03-16 Sex: female Admission Date (Current Location): 06/13/2016  [facility] and [place] Number:  Producer, television/film/video and Address:  [facility] [place], [address] [place], [place] [address]      Provider Number: [id number]  Attending Physician Name and Address:  Burns Spain, MD  Relative Name and Phone Number:       Current Level of Care: Hospital Recommended Level of Care: Skilled Nursing Facility Prior Approval Number:    Date Approved/Denied:   PASRR Number: [id number]  Discharge Plan: SNF    Current Diagnoses: Patient Active Problem List   Diagnosis Date Noted  . Pressure injury of skin 06/14/2016  . Acute cholecystitis 06/13/2016  . Hypertension 06/13/2016  . Chronic kidney disease 06/13/2016  . Ureterolithiasis   . Dementia   . Constipation 08/30/2011    Orientation RESPIRATION BLADDER Height & Weight     Self  Normal Indwelling catheter, Incontinent Weight: 161 lb 11.2 oz (73.3 kg) Height:  5' (152.4 cm)  BEHAVIORAL SYMPTOMS/MOOD NEUROLOGICAL BOWEL NUTRITION STATUS      Continent Diet (Soft diet; thin fluids)  AMBULATORY STATUS COMMUNICATION OF NEEDS Skin   Extensive Assist Verbally PU Stage and Appropriate Care (Right; left heel) PU Stage 1 Dressing: Daily                     Personal Care Assistance Level of Assistance  Bathing, Feeding, Dressing Bathing Assistance: Maximum assistance Feeding assistance: Limited assistance Dressing Assistance: Maximum assistance     Functional Limitations Info  Sight, Hearing, Speech Sight Info: Adequate Hearing Info: Adequate Speech Info: Adequate    SPECIAL CARE FACTORS FREQUENCY  PT (By licensed PT)     PT Frequency: 3x              Contractures Contractures Info: Not present    Additional Factors Info  Code Status, Allergies Code Status Info:  DNR Allergies Info: Sulfa Antibiotics, Orange (Diagnostic), Penicillins           Current Medications (06/16/2016):  This is the current hospital active medication list Current Facility-Administered Medications  Medication Dose Route Frequency Provider Last Rate Last Dose  . acetaminophen (TYLENOL) tablet 650 mg  650 mg Oral Q8H Marcos Eke, PA-C   650 mg at 06/16/16 5277  . amoxicillin-clavulanate (AUGMENTIN) 500-125 MG per tablet 500 mg  1 tablet Oral BID Burns Spain, MD      . bisacodyl (DULCOLAX) suppository 10 mg  10 mg Rectal Daily PRN Marcos Eke, PA-C      . feeding supplement (ENSURE ENLIVE) (ENSURE ENLIVE) liquid 237 mL  237 mL Oral TID BM Nischal Narendra, MD   237 mL at 06/16/16 1438  . heparin injection 5,000 Units  5,000 Units Subcutaneous Q8H Jessica Ratliff Hoffman, DO   5,000 Units at 06/16/16 0508  . hydrALAZINE (APRESOLINE) injection 5-10 mg  5-10 mg Intravenous Q8H PRN Marcos Eke, PA-C   5 mg at 06/13/16 1157  . HYDROcodone-acetaminophen (NORCO/VICODIN) 5-325 MG per tablet 1-2 tablet  1-2 tablet Oral Q4H PRN Marcos Eke, PA-C   1 tablet at 06/16/16 8242  . mirtazapine (REMERON) tablet 7.5 mg  7.5 mg Oral QHS Marcos Eke, PA-C   7.5 mg at 06/15/16 2007  . ondansetron (ZOFRAN) tablet 4 mg  4 mg Oral Q6H PRN Marcos Eke, PA-C  Or  . ondansetron (ZOFRAN) injection 4 mg  4 mg Intravenous Q6H PRN Marcos Eke, PA-C   4 mg at 06/14/16 1953  . senna-docusate (Senokot-S) tablet 1 tablet  1 tablet Oral QHS PRN Marcos Eke, PA-C      . sodium chloride flush (NS) 0.9 % injection 3 mL  3 mL Intravenous Q12H Marcos Eke, PA-C   3 mL at 06/13/16 2200     Discharge Medications: Please see discharge summary for a list of discharge medications.  Relevant Imaging Results:  Relevant Lab Results:   Additional Information SSN: [id number]  Dominic Pea, LCSW

## 2016-06-16 NOTE — Clinical Social Work Note (Signed)
Pt is ready for discharge today and will return to [facility]. Pt sleeping and only oriented to person. Daughter- Adele Dan at bedside and is aware and in agreement with d/c plan. CSW confirmed with Bjorn Loser at [facility] pt d/c today. CSW confirmed with Irving Burton Scarce [phone]) at [facility] that pt will transport using [facility]. CSW put room and report in treatment team sticky note and RN aware. CSW arranged transportation with [facility]. CSW signing off as no further SW needs identified.   Corlis Hove, LCSWA, LCASA  Clinical Social Work [phone]

## 2016-06-16 NOTE — Progress Notes (Signed)
  Date: 06/16/2016  Patient name: Dana Tate  Medical record number: [id number]  Date of birth: 07-20-1922   I have seen and evaluated this patient and I have discussed the plan of care with the house staff. Please see their note for complete details. I concur with their findings with the following additions/corrections: Ms. Inglett was evaluated in bed during morning rounds. When asked about pain, she pointed to her abdomen. Due to her underlying dementia, further history and review of systems are unable to be obtained. It would be unlikely that she has abdominal pain due to the recent procedures an indwelling drain. We provided her with oral opioids and recommended scheduled Tylenol. Opioids will be continued PRN. She is stable to be discharged to her skilled nursing facility today. Duration of antibiotics will need to be discussed with surgery.  Burns Spain, MD 06/16/2016, 1:57 PM

## 2016-06-16 NOTE — Progress Notes (Signed)
[facility] Progress Note     Subjective: CC; Pain  Pt states pain from her toes to her head. She is not nauseated or has vomiting. She did try to eat a little this morning.   Objective: Vital signs in last 24 hours: Temp:  [97.5 F (36.4 C)-98.6 F (37 C)] 98.6 F (37 C) (04/19 0955) Pulse Rate:  [66-80] 66 (04/19 0955) Resp:  [18] 18 (04/19 0512) BP: (133-141)/(53-72) 141/71 (04/19 0955) SpO2:  [94 %-100 %] 99 % (04/19 0955) Weight:  [161 lb 6.4 oz (73.2 kg)-161 lb 11.2 oz (73.3 kg)] 161 lb 11.2 oz (73.3 kg) (04/19 0512) Last BM Date: 06/15/16  Intake/Output from previous day: 04/18 0701 - 04/19 0700 In: 100 [IV Piggyback:100] Out: 100 [Drains:100] Intake/Output this shift: Total I/O In: 120 [P.O.:120] Out: 100 [Drains:100]  PE: Gen:  Alert, cooperative, appears uncomfortable and in pain Card:  RRR, no murmur heard Pulm:  Rate and effort normal Abd: Soft, not distended, +BS, generalized TTP, drain site C/D/I, drain with moderate bilious drainage Skin: no rashes noted, warm and dry  Lab Results:   Recent Labs  06/15/16 1206 06/16/16 0749  WBC 8.8 7.6  HGB 9.8* 9.6*  HCT 31.1* 30.4*  PLT 240 232   BMET  Recent Labs  06/15/16 0243 06/16/16 0749  NA 136 139  K 4.0 4.3  CL 112* 115*  CO2 17* 19*  GLUCOSE 80 83  BUN 21* 21*  CREATININE 1.35* 1.40*  CALCIUM 7.5* 7.8*   PT/INR  Recent Labs  06/14/16 0807  LABPROT 14.9  INR 1.16   CMP     Component Value Date/Time   NA 139 06/16/2016 0749   K 4.3 06/16/2016 0749   CL 115 (H) 06/16/2016 0749   CO2 19 (L) 06/16/2016 0749   GLUCOSE 83 06/16/2016 0749   BUN 21 (H) 06/16/2016 0749   CREATININE 1.40 (H) 06/16/2016 0749   CALCIUM 7.8 (L) 06/16/2016 0749   PROT 4.8 (L) 06/16/2016 0744   ALBUMIN 1.9 (L) 06/16/2016 0744   AST 35 06/16/2016 0744   ALT 31 06/16/2016 0744   ALKPHOS 62 06/16/2016 0744   BILITOT 0.4 06/16/2016 0744   GFRNONAA 32 (L) 06/16/2016 0749   GFRAA 37 (L)  06/16/2016 0749   Lipase     Component Value Date/Time   LIPASE 46 06/13/2016 0202       Studies/Results: Ir Perc Cholecystostomy  Result Date: 06/14/2016 INDICATION: Acute cholecystitis EXAM: CHOLECYSTOSTOMY MEDICATIONS: None.  Patient is on Zosyn. ANESTHESIA/SEDATION: Fentanyl 50 mcg IV; Versed 1.5 mg IV Moderate Sedation Time:  15 minutes. The patient was continuously monitored during the procedure by the interventional radiology nurse under my direct supervision. FLUOROSCOPY TIME:  Fluoroscopy Time: 1 minutes 18 seconds (3 mGy). COMPLICATIONS: None immediate. PROCEDURE: Informed written consent was obtained from the patient after a thorough discussion of the procedural risks, benefits and alternatives. All questions were addressed. Maximal Sterile Barrier Technique was utilized including caps, mask, sterile gowns, sterile gloves, sterile drape, hand hygiene and skin antiseptic. A timeout was performed prior to the initiation of the procedure. The right upper quadrant was prepped with Betadine in a sterile fashion, and a sterile drape was applied covering the operative field. A sterile gown and sterile gloves were used for the procedure. A 21 gauge needle was inserted into the gallbladder lumen under sonographic guidance and via transhepatic approach. It was removed over a 018 wire, which was upsized to a 3-J. A 10-French drain was advanced  over the wire and coiled in the gallbladder lumen. It was sewn to the skin after being string fixed. FINDINGS: The cystic duct is occluded. Imaging documents 10 French drain placement coiled in the lumen of the gallbladder. IMPRESSION: Successful cholecystostomy. This needs to remain in place at least 6 weeks. Electronically Signed   By: Jolaine Click M.D.   On: 06/14/2016 13:26    Anti-infectives: Anti-infectives    Start     Dose/Rate Route Frequency Ordered Stop   06/13/16 0800  piperacillin-tazobactam (ZOSYN) IVPB 2.25 g     2.25 g 100 mL/hr over 30  Minutes Intravenous Every 8 hours 06/13/16 0732     06/13/16 0500  cefTRIAXone (ROCEPHIN) 1 g in dextrose 5 % 50 mL IVPB     1 g 100 mL/hr over 30 Minutes Intravenous  Once 06/13/16 0458 06/13/16 0723   06/13/16 0500  metroNIDAZOLE (FLAGYL) IVPB 500 mg     500 mg 100 mL/hr over 60 Minutes Intravenous  Once 06/13/16 0458 06/13/16 1610       Assessment/Plan  Acute cholecystitis s/p percutaneous cholecystostomy via IR - bilious drain output, culture pending - HIDA 06/13/16 positive for cystic duct obstruction - continue abx and drain for 4-6 weeks - can switch to PO antibiotics (Augmentin) for a total of 5 days and f/u with Dr. Janee Morn - Will return to [facility] when discharged  Surgery will sign off at this time. Please page [place] with any questions or concerns. Thank you   LOS: 3 days    Jerre Simon , [facility] Surgery 06/16/2016, 10:13 AM Pager: [phone] Consults: [phone] Mon-Fri 7:00 am-4:30 pm Sat-Sun 7:00 am-11:30 am

## 2016-06-16 NOTE — Progress Notes (Addendum)
   Subjective: Patient was evaluated this morning on rounds. She complained of right upper quadrant pain.    Objective:  Vital signs in last 24 hours: Vitals:   06/15/16 1355 06/15/16 2041 06/16/16 0512 06/16/16 0955  BP:  (!) 141/72 (!) 133/54 (!) 141/71  Pulse:  80 70 66  Resp:  18 18   Temp:  97.5 F (36.4 C) 97.5 F (36.4 C) 98.6 F (37 C)  TempSrc:  Oral Oral Oral  SpO2:  94% 100% 99%  Weight: 161 lb 6.4 oz (73.2 kg)  161 lb 11.2 oz (73.3 kg)   Height:       Physical Exam  Cardiovascular: Normal rate, regular rhythm and normal heart sounds.  Exam reveals no gallop and no friction rub.   No murmur heard. Pulmonary/Chest: Effort normal and breath sounds normal. No respiratory distress. She has no wheezes. She has no rales.  Abdominal: Soft.  Tenderness in RUQ Drain in place with bilious drainage   Musculoskeletal: She exhibits no edema.  Skin: Skin is warm and dry.    Assessment/Plan:  Principal Problem:   Acute cholecystitis Active Problems:   Dementia   Constipation   Hypertension   Chronic kidney disease   Ureterolithiasis   Pressure injury of skin  Acute Cholecystitis s/p percutaneous cholecystostomy  Patient had drain placed 4/17 as she is not a candidate for cholecystectomy.  Patient is afebrile with no leukocytosis.  Patient reports pain in her right upper quadrant.  Drain in place with bilious drainage.  Surgical deep wound shows NGTD and blood cultures show NGTD.    - Hydrocodone-acetaminophen 5-325mg  1-2 tablets QH4 PRN  - Will return to [facility] SNF - soft diet - On Zosyn will transition to oral Augmentin today  - per surgery drain in place for 6 weeks  Microcytic anemia Patient's hemoglobin has been stable, currently 9.6.  No obvious signs of bleeding. - transfuse Hgb < 7  Hypertension Hypertensive on admission, now normotensive. Not on home antihypertensives. Received one dose of hydralazine on admission. -Continue hydralazine 5-10 mg Q8H  prn  Chronic Constipation Patient is on multple laxative and stool softeners at home.  -Continue senokot prn -Continue Dulcolax prn  Dispo: Anticipated discharge to SNF today    Camelia Phenes, DO 06/16/2016, 10:40 AM Pager: [phone]

## 2016-06-18 LAB — CULTURE, BLOOD (ROUTINE X 2)
CULTURE: NO GROWTH
Culture: NO GROWTH
Special Requests: ADEQUATE
Special Requests: ADEQUATE

## 2016-06-19 LAB — AEROBIC/ANAEROBIC CULTURE (SURGICAL/DEEP WOUND): CULTURE: NO GROWTH

## 2016-06-19 LAB — AEROBIC/ANAEROBIC CULTURE W GRAM STAIN (SURGICAL/DEEP WOUND)

## 2016-07-08 ENCOUNTER — Emergency Department (HOSPITAL_COMMUNITY): Payer: Medicare (Managed Care)

## 2016-07-08 ENCOUNTER — Encounter (HOSPITAL_COMMUNITY): Payer: Self-pay | Admitting: Emergency Medicine

## 2016-07-08 ENCOUNTER — Emergency Department (HOSPITAL_COMMUNITY)
Admission: EM | Admit: 2016-07-08 | Discharge: 2016-07-09 | Disposition: A | Discharge status: Home / Self Care | Payer: Medicare (Managed Care) | Attending: Emergency Medicine | Admitting: Emergency Medicine

## 2016-07-08 DIAGNOSIS — R935 Abnormal findings on diagnostic imaging of other abdominal regions, including retroperitoneum: Secondary | ICD-10-CM | POA: Insufficient documentation

## 2016-07-08 DIAGNOSIS — Y939 Activity, unspecified: Secondary | ICD-10-CM | POA: Insufficient documentation

## 2016-07-08 DIAGNOSIS — Z79899 Other long term (current) drug therapy: Secondary | ICD-10-CM | POA: Diagnosis not present

## 2016-07-08 DIAGNOSIS — S52125A Nondisplaced fracture of head of left radius, initial encounter for closed fracture: Secondary | ICD-10-CM

## 2016-07-08 DIAGNOSIS — Y999 Unspecified external cause status: Secondary | ICD-10-CM | POA: Insufficient documentation

## 2016-07-08 DIAGNOSIS — W19XXXA Unspecified fall, initial encounter: Secondary | ICD-10-CM | POA: Diagnosis not present

## 2016-07-08 DIAGNOSIS — Z434 Encounter for attention to other artificial openings of digestive tract: Secondary | ICD-10-CM | POA: Diagnosis not present

## 2016-07-08 DIAGNOSIS — N189 Chronic kidney disease, unspecified: Secondary | ICD-10-CM | POA: Diagnosis not present

## 2016-07-08 DIAGNOSIS — Y929 Unspecified place or not applicable: Secondary | ICD-10-CM | POA: Insufficient documentation

## 2016-07-08 DIAGNOSIS — A419 Sepsis, unspecified organism: Secondary | ICD-10-CM | POA: Diagnosis not present

## 2016-07-08 DIAGNOSIS — Z91010 Allergy to peanuts: Secondary | ICD-10-CM | POA: Diagnosis not present

## 2016-07-08 DIAGNOSIS — I129 Hypertensive chronic kidney disease with stage 1 through stage 4 chronic kidney disease, or unspecified chronic kidney disease: Secondary | ICD-10-CM | POA: Diagnosis not present

## 2016-07-08 DIAGNOSIS — S59912A Unspecified injury of left forearm, initial encounter: Secondary | ICD-10-CM | POA: Diagnosis present

## 2016-07-08 LAB — CBC WITH DIFFERENTIAL/PLATELET
Basophils Absolute: 0 10*3/uL (ref 0.0–0.1)
Basophils Relative: 0 %
Eosinophils Absolute: 0.3 10*3/uL (ref 0.0–0.7)
Eosinophils Relative: 3 %
HCT: 35.1 % — ABNORMAL LOW (ref 36.0–46.0)
HEMOGLOBIN: 11.4 g/dL — AB (ref 12.0–15.0)
LYMPHS PCT: 26 %
Lymphs Abs: 2.4 10*3/uL (ref 0.7–4.0)
MCH: 31.1 pg (ref 26.0–34.0)
MCHC: 32.5 g/dL (ref 30.0–36.0)
MCV: 95.9 fL (ref 78.0–100.0)
Monocytes Absolute: 0.8 10*3/uL (ref 0.1–1.0)
Monocytes Relative: 8 %
NEUTROS PCT: 63 %
Neutro Abs: 5.8 10*3/uL (ref 1.7–7.7)
Platelets: 330 10*3/uL (ref 150–400)
RBC: 3.66 MIL/uL — AB (ref 3.87–5.11)
RDW: 14.8 % (ref 11.5–15.5)
WBC: 9.2 10*3/uL (ref 4.0–10.5)

## 2016-07-08 LAB — COMPREHENSIVE METABOLIC PANEL
ALK PHOS: 142 U/L — AB (ref 38–126)
ALT: 39 U/L (ref 14–54)
AST: 95 U/L — ABNORMAL HIGH (ref 15–41)
Albumin: 3.1 g/dL — ABNORMAL LOW (ref 3.5–5.0)
Anion gap: 6 (ref 5–15)
BILIRUBIN TOTAL: 1 mg/dL (ref 0.3–1.2)
BUN: 36 mg/dL — ABNORMAL HIGH (ref 6–20)
CALCIUM: 8.8 mg/dL — AB (ref 8.9–10.3)
CO2: 24 mmol/L (ref 22–32)
CREATININE: 1.44 mg/dL — AB (ref 0.44–1.00)
Chloride: 114 mmol/L — ABNORMAL HIGH (ref 101–111)
GFR, EST AFRICAN AMERICAN: 35 mL/min — AB (ref >60)
GFR, EST NON AFRICAN AMERICAN: 31 mL/min — AB (ref >60)
Glucose, Bld: 119 mg/dL — ABNORMAL HIGH (ref 65–99)
Potassium: 5.2 mmol/L — ABNORMAL HIGH (ref 3.5–5.1)
Sodium: 144 mmol/L (ref 135–145)
TOTAL PROTEIN: 7.5 g/dL (ref 6.5–8.1)

## 2016-07-08 LAB — URINALYSIS, ROUTINE W REFLEX MICROSCOPIC
Bilirubin Urine: NEGATIVE
Glucose, UA: NEGATIVE mg/dL
Hgb urine dipstick: NEGATIVE
Ketones, ur: NEGATIVE mg/dL
Nitrite: NEGATIVE
PH: 5 (ref 5.0–8.0)
Protein, ur: NEGATIVE mg/dL
Specific Gravity, Urine: 1.03 (ref 1.005–1.030)

## 2016-07-08 LAB — LACTIC ACID, PLASMA: LACTIC ACID, VENOUS: 2.1 mmol/L — AB (ref 0.5–1.9)

## 2016-07-08 LAB — I-STAT CG4 LACTIC ACID, ED: Lactic Acid, Venous: 2.15 mmol/L (ref 0.5–1.9)

## 2016-07-08 LAB — POTASSIUM: POTASSIUM: 3.7 mmol/L (ref 3.5–5.1)

## 2016-07-08 LAB — PROCALCITONIN

## 2016-07-08 LAB — PROTIME-INR
INR: 1.12
Prothrombin Time: 14.4 seconds (ref 11.4–15.2)

## 2016-07-08 LAB — APTT: APTT: 32 s (ref 24–36)

## 2016-07-08 MED ORDER — METRONIDAZOLE IN NACL 5-0.79 MG/ML-% IV SOLN
500.0000 mg | Freq: Once | INTRAVENOUS | Status: AC
  Administered 2016-07-08: 500 mg via INTRAVENOUS
  Filled 2016-07-08: qty 100

## 2016-07-08 MED ORDER — LEVOFLOXACIN IN D5W 500 MG/100ML IV SOLN: 500.0000 mg | INTRAVENOUS | Status: DC

## 2016-07-08 MED ORDER — LEVOFLOXACIN IN D5W 750 MG/150ML IV SOLN
750.0000 mg | Freq: Once | INTRAVENOUS | Status: AC
  Administered 2016-07-08: 750 mg via INTRAVENOUS
  Filled 2016-07-08: qty 150

## 2016-07-08 MED ORDER — IOPAMIDOL (ISOVUE-300) INJECTION 61%
INTRAVENOUS | Status: AC
  Administered 2016-07-08: 80 mL via INTRAVENOUS
  Filled 2016-07-08: qty 100

## 2016-07-08 MED ORDER — SODIUM CHLORIDE 0.9 % IV BOLUS (SEPSIS)
1000.0000 mL | Freq: Once | INTRAVENOUS | Status: AC
  Administered 2016-07-08: 1000 mL via INTRAVENOUS

## 2016-07-08 MED ORDER — SODIUM CHLORIDE 0.9 % IV BOLUS (SEPSIS)
250.0000 mL | Freq: Once | INTRAVENOUS | Status: AC
  Administered 2016-07-08: 250 mL via INTRAVENOUS

## 2016-07-08 MED ORDER — METRONIDAZOLE IN NACL 5-0.79 MG/ML-% IV SOLN
500.0000 mg | Freq: Three times a day (TID) | INTRAVENOUS | Status: DC
  Administered 2016-07-08: 500 mg via INTRAVENOUS
  Filled 2016-07-08: qty 100

## 2016-07-08 NOTE — Discharge Instructions (Signed)
No sign of intraabdominal infection.  Gallbladder tube in place.   Left elbow fracture with splint placed.  She will need to be seen in follow up by orthopedist.

## 2016-07-08 NOTE — ED Notes (Signed)
XR at bedside

## 2016-07-08 NOTE — ED Triage Notes (Signed)
[facility] due to possible clogged colon drain. Pt is at baseline with hx of dementia.

## 2016-07-08 NOTE — ED Notes (Addendum)
Pt has a lactic acid of 2.15 EDP Ray and RN Sopencer made aware.

## 2016-07-08 NOTE — ED Notes (Signed)
Pt is verbally responsive and is confused with intermittent crying

## 2016-07-08 NOTE — ED Provider Notes (Signed)
[facility]-EMERGENCY DEPT Provider Note   CSN: [id number] Arrival date & time: 07/08/16  1341     History   Chief Complaint Chief Complaint  Patient presents with  . Clogged Tube    HPI Dana Tate is a 83 y.o. female. Level V caveat5 secondary to inferior and dementia HPI  History initially obtained from Dr. Charissa Bash and then history obtained from daughter. 83 year old female with a history of cholecystitis who is deemed a non-surgical candidate. She had a drain placed. She has appeared to have some increased pain today and has had more lethargy. Her by mouth intake has been decreased. Patient is at [facility] in long-term care. Dr. Mayford Knife is concerned that she is having worsening of her cholecystitis due to drain being displaced. She was treated previously with drain placement and antibiotics. Daughter reports that she has fallen sometime in the past 2 days and is complaining of pain in her hand. The daughter is not clear on any other factors regarding the fall. She did state she felt one time when she was there with her. She did not think there was loss of consciousness.  Past Medical History:  Diagnosis Date  . Acute cholecystitis 05/2016  . Anxiety   . Chronic kidney disease   . Dementia   . Depression   . Dizziness   . Gait abnormality   . Hypertension   . Memory loss   . Tinea pedis   . Weight loss     Patient Active Problem List   Diagnosis Date Noted  . Pressure injury of skin 06/14/2016  . Acute cholecystitis 06/13/2016  . Hypertension 06/13/2016  . Chronic kidney disease 06/13/2016  . Ureterolithiasis   . Dementia   . Constipation 08/30/2011    Past Surgical History:  Procedure Laterality Date  . CATARACT EXTRACTION    . IR PERC CHOLECYSTOSTOMY  06/14/2016  . TUBAL LIGATION      OB History    Gravida Para Term Preterm AB Living   3 2 1 1 1 1    SAB TAB Ectopic Multiple Live Births   1               Home Medications    Prior to Admission  medications   Medication Sig Start Date End Date Taking? Authorizing Provider  acetaminophen (TYLENOL ARTHRITIS PAIN) 650 MG CR tablet Take 650 mg by mouth 3 (three) times daily as needed for pain.   Yes [provider]  acetaminophen (TYLENOL) 500 MG tablet Take 500 mg by mouth 3 (three) times daily.   Yes [provider]  ENSURE (ENSURE) Take 237 mLs by mouth 3 (three) times daily.    Yes [provider]  Menthol, Topical Analgesic, (BIOFREEZE EX) Apply 1 application topically 2 (two) times daily as needed (pain). 3.5% GEL   Yes [provider]  mirtazapine (REMERON) 15 MG tablet Take 7.5 mg by mouth at bedtime.    Yes [provider]  Nutritional Supplements (NUTRITIONAL DRINK) LIQD Take 120 mLs by mouth 3 (three) times daily. *Med Pass*   Yes [provider]  oxycodone (OXY-IR) 5 MG capsule Take 2.5 mg by mouth every 8 (eight) hours as needed for pain.   Yes [provider]  polyethylene glycol (MIRALAX / GLYCOLAX) packet Take 17 g by mouth daily as needed for mild constipation.   Yes [provider]  senna-docusate (SENOKOT-S) 8.6-50 MG tablet Take 2 tablets by mouth at bedtime.   Yes [provider]  Sodium Phosphates (RA SALINE ENEMA RE) Place 1 Applicatorful rectally daily as needed (constipatin).   Yes [provider]  amoxicillin-clavulanate (AUGMENTIN) 500-125 MG tablet Take 1 tablet (500 mg total) by mouth 2 (two) times daily. Patient not taking: Reported on 07/08/2016 06/16/16   Geralyn Corwin Ratliff, DO  HYDROcodone-acetaminophen (NORCO/VICODIN) 5-325 MG tablet Take 1 tablet by mouth every 6 (six) hours as needed for severe pain. Patient not taking: Reported on 07/08/2016 06/16/16   Camelia Phenes, DO    Family History Family History  Problem Relation Age of Onset  . Heart attack Father     Social History Social History  Substance Use Topics  . Smoking status: Never Smoker  .  Smokeless tobacco: Never Used  . Alcohol use No     Allergies   Sulfa antibiotics; Orange (diagnostic); Peanut-containing drug products; and Penicillins   Review of Systems Review of Systems  Unable to perform ROS: Dementia     Physical Exam Updated Vital Signs BP (!) 147/94   Pulse 73   Temp 98.5 F (36.9 C) (Rectal)   Resp 18   Wt 42.6 kg   SpO2 93%   BMI 18.36 kg/m   Physical Exam  Constitutional: She appears well-developed and well-nourished. No distress.  HENT:  Head: Normocephalic and atraumatic.  Right Ear: External ear normal.  Left Ear: External ear normal.  Mouth/Throat: Oropharynx is clear and moist.  Eyes: EOM are normal. Pupils are equal, round, and reactive to light.  Neck: Normal range of motion. Neck supple.  Cardiovascular: Normal rate and regular rhythm.   Pulmonary/Chest: Effort normal.  Abdominal: Bowel sounds are normal. She exhibits no distension.  Biliary tube in place and appears to be draining well. There is yellowish fluid present in the fluid in the bag. Abdomen soft this difficult to assess if there is focal tenderness as the patient moans with most palpation.  Musculoskeletal:  Patient tender to palpation over left elbow Shoulders with some tenderness over left shoulder. No tenderness over pelvis with watch range of motion of bilateral hips, knees, ankles.  Neurological: She is alert.  Skin: Skin is warm. Capillary refill takes less than 2 seconds.  Psychiatric: She has a normal mood and affect.  Nursing note and vitals reviewed.    ED Treatments / Results  Labs (all labs ordered are listed, but only abnormal results are displayed) Labs Reviewed  CBC WITH DIFFERENTIAL/PLATELET - Abnormal; Notable for the following:       Result Value   RBC 3.66 (*)    Hemoglobin 11.4 (*)    HCT 35.1 (*)    All other components within normal limits  COMPREHENSIVE METABOLIC PANEL - Abnormal; Notable for the following:    Potassium 5.2 (*)     Chloride 114 (*)    Glucose, Bld 119 (*)    BUN 36 (*)    Creatinine, Ser 1.44 (*)    Calcium 8.8 (*)    Albumin 3.1 (*)    AST 95 (*)    Alkaline Phosphatase 142 (*)    GFR calc non Af Amer 31 (*)    GFR calc Af Amer 35 (*)    All other components within normal limits  LACTIC ACID, PLASMA - Abnormal; Notable for the following:    Lactic Acid, Venous 2.1 (*)    All other components within normal limits  URINALYSIS, ROUTINE W REFLEX MICROSCOPIC - Abnormal; Notable for the following:    APPearance HAZY (*)    Leukocytes,  UA TRACE (*)    Bacteria, UA FEW (*)    Squamous Epithelial / LPF 0-5 (*)    All other components within normal limits  I-STAT CG4 LACTIC ACID, ED - Abnormal; Notable for the following:    Lactic Acid, Venous 2.15 (*)    All other components within normal limits  CULTURE, BLOOD (ROUTINE X 2)  CULTURE, BLOOD (ROUTINE X 2)  PROCALCITONIN  PROTIME-INR  APTT  LACTIC ACID, PLASMA  POTASSIUM    EKG  EKG Interpretation None       Radiology Dg Pelvis 1-2 Views  Result Date: 07/08/2016 CLINICAL DATA:  Unwitnessed fall today with pelvic pain. EXAM: PELVIS - 1-2 VIEW COMPARISON:  None. FINDINGS: Diffuse decreased bone mineralization. There is mild bilateral degenerative change of the hips. There is no definite acute fracture or dislocation. There are degenerative changes of the spine. Contrast is present within the bladder. There is a catheter over the bladder likely a suprapubic catheter. IMPRESSION: No acute findings. Electronically Signed   By: Elberta Fortis M.D.   On: 07/08/2016 19:09   Dg Shoulder Right  Result Date: 07/08/2016 CLINICAL DATA:  Unwitnessed fall today with right shoulder pain. EXAM: RIGHT SHOULDER - 2+ VIEW COMPARISON:  None. FINDINGS: Two views demonstrate no definite fracture or dislocation. Remainder of the exam is unremarkable. IMPRESSION: No acute findings. Electronically Signed   By: Elberta Fortis M.D.   On: 07/08/2016 19:06   Dg Elbow 2  Views Right  Result Date: 07/08/2016 CLINICAL DATA:  Unwitnessed fall today with right elbow pain. EXAM: RIGHT ELBOW - 2 VIEW COMPARISON:  None. FINDINGS: Diffuse decreased bone mineralization. There is mild degenerative change of the elbow joint. There are findings indicating a minimally displaced fracture involving the radial head. IMPRESSION: Evidence of a radial head fracture. Degenerative change of the elbow. Electronically Signed   By: Elberta Fortis M.D.   On: 07/08/2016 19:08   Ct Abdomen Pelvis W Contrast  Result Date: 07/08/2016 CLINICAL DATA:  Cholecystitis post percutaneous cholecystostomy on 06/14/2016, question biliary drain obstruction, history dementia, hypertension EXAM: CT ABDOMEN AND PELVIS WITH CONTRAST TECHNIQUE: Multidetector CT imaging of the abdomen and pelvis was performed using the standard protocol following bolus administration of intravenous contrast. Sagittal and coronal MPR images reconstructed from axial data set. CONTRAST:  80mL ISOVUE-300 IOPAMIDOL (ISOVUE-300) INJECTION 61% IV. No oral contrast administered. COMPARISON:  06/13/2016 FINDINGS: Lower chest: Minimal bibasilar atelectasis Hepatobiliary: Cholecystostomy tube identified at gallbladder. Gallbladder appears decompressed. No biliary dilatation. Liver normal appearance. Pancreas: Atrophic pancreas with minimal ductal prominence. Spleen: Normal appearance Adrenals/Urinary Tract: Adrenal glands unremarkable. Markedly atrophic RIGHT kidney. LEFT kidney normal appearance. Calculus at LEFT ureteropelvic junction again seen 5 mm diameter image 18. Ureters and bladder otherwise unremarkable. Stomach/Bowel: Normal appendix. Moderate-sized hiatal hernia. Increased stool in rectum. Within limitations of exam bowel loops otherwise unremarkable. Vascular/Lymphatic: Atherosclerotic calcifications aorta and iliac arteries without aneurysm. Coronary arterial calcification noted. No adenopathy. Reproductive: Atrophic uterus with  unremarkable adnexa Other: No free air free fluid.  No hernia. Musculoskeletal: Bones demineralized with degenerative disc disease changes thoracolumbar spine. New compression fracture of T11 vertebral body with 40-50% anterior height loss. IMPRESSION: Cholecystostomy tube without biliary dilatation or gallbladder dilatation. Aortic atherosclerosis and coronary arterial calcification. New compression fracture of T11 vertebral body with 40-50% anterior height loss. Increased stool in rectum. Persistent 5 mm LEFT UPJ calculus without significant hydronephrosis. Electronically Signed   By: Ulyses Southward M.D.   On: 07/08/2016 17:34   Dg Chest  Port 1 View  Result Date: 07/08/2016 CLINICAL DATA:  Dementia. EXAM: PORTABLE CHEST 1 VIEW COMPARISON:  08/30/2015 FINDINGS: The heart is enlarged. There is aortic atherosclerosis. Hiatal hernia again noted. There is small amount of pleural fluid bilaterally. There is abnormal density in both lower lobes consistent with atelectasis and/or pneumonia. Upper lungs are clear. No acute bone finding. Percutaneous drain in the right upper quadrant. IMPRESSION: Cardiomegaly.  Aortic atherosclerosis. Small pleural effusions. Atelectasis and/or pneumonia in both lower lobes. Electronically Signed   By: Paulina FusiMark  Shogry M.D.   On: 07/08/2016 15:45   Dg Hand Complete Right  Result Date: 07/08/2016 CLINICAL DATA:  Unwitnessed fall today with right hand pain. EXAM: RIGHT HAND - COMPLETE 3+ VIEW COMPARISON:  None. FINDINGS: There is diffuse decreased bone mineralization. Mild degenerative change of the carpal bones and first carpometacarpal joint. Mild degenerative change over the MCP joints and interphalangeal joints. No definite evidence of acute fracture or dislocation. IMPRESSION: No acute findings. Degenerative changes as described. Electronically Signed   By: Elberta Fortisaniel  Boyle M.D.   On: 07/08/2016 19:05    Procedures Procedures (including critical care time)  Medications Ordered in  ED Medications  metroNIDAZOLE (FLAGYL) IVPB 500 mg (not administered)  levofloxacin (LEVAQUIN) IVPB 500 mg (not administered)  levofloxacin (LEVAQUIN) IVPB 750 mg (0 mg Intravenous Stopped 07/08/16 1950)  metroNIDAZOLE (FLAGYL) IVPB 500 mg (0 mg Intravenous Stopped 07/08/16 1752)  sodium chloride 0.9 % bolus 1,000 mL (0 mLs Intravenous Stopped 07/08/16 1752)    And  sodium chloride 0.9 % bolus 250 mL (0 mLs Intravenous Stopped 07/08/16 1926)  iopamidol (ISOVUE-300) 61 % injection (80 mLs Intravenous Contrast Given 07/08/16 1713)     Initial Impression / Assessment and Plan / ED Course  I have reviewed the triage vital signs and the nursing notes.  Pertinent labs & imaging results that were available during my care of the patient were reviewed by me and considered in my medical decision making (see chart for details).   1 abdominal pain-again this is not clear who was suspected by her caregivers. Given the patient's recent cholecystitis and drain, CT was obtained. Cholecystostomy tube in place. Gallbladder appears decompressed. Liver appears normal without any biliary dilatation. Patient does have a ureteropelvic kidney stone at 5 mm this is unchanged from prior 2-left radial head fracture seen on plain x-rays. Splint placed on arm. Plan referral for follow-up to orthotopic 3-initial lactic acid was elevated at 2.1. No definitive source of any infection found. Repeat lactic acid is 2.15 and patient has been hemodynamically stable. I do not think that this is likely significant the setting. 4 initial potassium was 5.2 recheck is 3.7   Final Clinical Impressions(s) / ED Diagnoses   Final diagnoses:  Sepsis (HCC)  Fall, initial encounter  Closed nondisplaced fracture of head of left radius, initial encounter  Cholecystostomy care [facility](HCC)    New Prescriptions New Prescriptions   No medications on file     Margarita Grizzleay, Marigene Erler, MD 07/08/16 2348

## 2016-07-08 NOTE — ED Notes (Signed)
Patient's PCP, Dr. Mayford KnifeWilliams called, reporting concern for displaced or clogged biliary drain as well as constipation. Requesting patient have imaging of RUQ.

## 2016-07-08 NOTE — Progress Notes (Signed)
Pharmacy Antibiotic Note  Dana Tate is a 83 y.o. female admitted on 07/08/2016 with intra-abdominal infection.  Pharmacy has been consulted for flagyl and levaquin dosing. Pt sent from [facility] due to possible clogged or displaced biliary drain. Hypothermic at 96.9, HR 84. RR 16. Flagyl 500 mg x 1 and Levaquin 750 mg IV x 1 ordered by EDP. Wt 42.6 kg, creat 1.44, creat cl ~ 17 ml/min.  PCN allergy = nausea only - she has gotten Rocephin and Zosyn in the past.   Plan:Rec change to Zosyn for IAI as she has received this before  flagyl 500 mg IV q8h Levaquin 750 mg IV x 1 dose then 500 mg IV q48 f/u renal fxn, wbc, temp, culture data    Temp (24hrs), Avg:97.6 F (36.4 C), Min:96.9 F (36.1 C), Max:98.2 F (36.8 C)  No results for input(s): WBC, CREATININE, LATICACIDVEN, VANCOTROUGH, VANCOPEAK, VANCORANDOM, GENTTROUGH, GENTPEAK, GENTRANDOM, TOBRATROUGH, TOBRAPEAK, TOBRARND, AMIKACINPEAK, AMIKACINTROU, AMIKACIN in the last 168 hours.  CrCl cannot be calculated (Patient's most recent lab result is older than the maximum 21 days allowed.).    Allergies  Allergen Reactions  . Sulfa Antibiotics Itching and Rash  . Orange (Diagnostic) Other (See Comments)    No reaction on MAR   . Peanut-Containing Drug Products Other (See Comments)    No reaction on MAR   . Penicillins Nausea Only    Has patient had a PCN reaction causing immediate rash, facial/tongue/throat swelling, SOB or lightheadedness with hypotension: Unknown Has patient had a PCN reaction causing severe rash involving mucus membranes or skin necrosis: Unknown Has patient had a PCN reaction that required hospitalization: Unknown Has patient had a PCN reaction occurring within the last 10 years: Unknown If all of the above answers are "NO", then may proceed with Cephalosporin use.    Antimicrobials this admission: Flagyl 5/11>>  lvq 5/11>> Dose adjustments this admission:   Microbiology results: 5/11 BCx2>>  Thank  you for allowing pharmacy to be a part of this patient's care.  Herby AbrahamMichelle T. Jaeliana Lococo, Pharm.D. [phone] 07/08/2016 3:30 PM

## 2016-07-08 NOTE — ED Notes (Signed)
First set of blood cultures sent to lab.

## 2016-07-08 NOTE — ED Notes (Signed)
Family at bedside. 

## 2016-07-08 NOTE — ED Notes (Signed)
Patient transported to CT 

## 2016-07-08 NOTE — ED Notes (Signed)
ED Provider at bedside. 

## 2016-07-08 NOTE — ED Notes (Signed)
Ortho tech at bedside 

## 2016-07-09 LAB — I-STAT CG4 LACTIC ACID, ED: LACTIC ACID, VENOUS: 1.93 mmol/L — AB (ref 0.5–1.9)

## 2016-07-09 NOTE — ED Notes (Signed)
Attempted to call [facility] to give report, No answer.  [facility] called.

## 2016-07-09 NOTE — ED Notes (Signed)
[facility] arrived.  

## 2016-07-10 ENCOUNTER — Telehealth (HOSPITAL_BASED_OUTPATIENT_CLINIC_OR_DEPARTMENT_OTHER): Payer: Self-pay | Admitting: *Deleted

## 2016-07-10 LAB — BLOOD CULTURE ID PANEL (REFLEXED)
ACINETOBACTER BAUMANNII: NOT DETECTED
Candida albicans: NOT DETECTED
Candida glabrata: NOT DETECTED
Candida krusei: NOT DETECTED
Candida parapsilosis: NOT DETECTED
Candida tropicalis: NOT DETECTED
ENTEROCOCCUS SPECIES: NOT DETECTED
Enterobacter cloacae complex: NOT DETECTED
Enterobacteriaceae species: NOT DETECTED
Escherichia coli: NOT DETECTED
HAEMOPHILUS INFLUENZAE: NOT DETECTED
Klebsiella oxytoca: NOT DETECTED
Klebsiella pneumoniae: NOT DETECTED
LISTERIA MONOCYTOGENES: NOT DETECTED
METHICILLIN RESISTANCE: DETECTED — AB
Neisseria meningitidis: NOT DETECTED
PSEUDOMONAS AERUGINOSA: NOT DETECTED
Proteus species: NOT DETECTED
SERRATIA MARCESCENS: NOT DETECTED
STREPTOCOCCUS AGALACTIAE: NOT DETECTED
STREPTOCOCCUS PNEUMONIAE: NOT DETECTED
Staphylococcus aureus (BCID): NOT DETECTED
Staphylococcus species: DETECTED — AB
Streptococcus pyogenes: NOT DETECTED
Streptococcus species: NOT DETECTED

## 2016-07-11 LAB — CULTURE, BLOOD (ROUTINE X 2): Special Requests: ADEQUATE

## 2016-07-12 ENCOUNTER — Ambulatory Visit (INDEPENDENT_AMBULATORY_CARE_PROVIDER_SITE_OTHER): Payer: Medicare (Managed Care) | Admitting: Orthopaedic Surgery

## 2016-07-12 DIAGNOSIS — S52124A Nondisplaced fracture of head of right radius, initial encounter for closed fracture: Secondary | ICD-10-CM | POA: Insufficient documentation

## 2016-07-12 NOTE — Progress Notes (Signed)
   Office Visit Note   Patient: Dana Tate           Date of Birth: [birth year]           MRN: [id number] Visit Date: 07/12/2016              Requested by: Jethro BastosKoehler, Robert N, MD [address] [place], [place] [id number] PCP: Jethro BastosKoehler, Robert N, MD   Assessment & Plan: Visit Diagnoses: No diagnosis found.  Plan: We will plan on treating this nonoperatively. Patient was placed and a loose Ace wrap for support. With her dementia she will not be able to comply with restrictions. Activity as tolerated and range of motion as tolerated. Follow-up with me as needed  Follow-Up Instructions: Return if symptoms worsen or fail to improve.   Orders:  No orders of the defined types were placed in this encounter.  No orders of the defined types were placed in this encounter.     Procedures: No procedures performed   Clinical Data: No additional findings.   Subjective: Chief Complaint  Patient presents with  . Right Hand - Pain    Patient is a severely demented 83 year old female who lives in a nursing home comes in with a right radial head fracture. Details of the fall are unknown.    Review of Systems  Unable to perform ROS: Dementia     Objective: Vital Signs: There were no vitals taken for this visit.  Physical Exam  Constitutional: She appears well-developed and well-nourished.  HENT:  Head: Normocephalic and atraumatic.  Eyes: EOM are normal.  Neck: Neck supple.  Pulmonary/Chest: Effort normal.  Abdominal: Soft.  Skin: Skin is warm. Capillary refill takes less than 2 seconds.  Psychiatric: She has a normal mood and affect. Her behavior is normal. Judgment and thought content normal.  Nursing note and vitals reviewed.   Ortho Exam Right elbow exam shows mild tenderness palpation over the radial head. Mild pain with range of motion of elbow. Specialty Comments:  No specialty comments available.  Imaging: No results found.   PMFS History: Patient Active  Problem List   Diagnosis Date Noted  . Pressure injury of skin 06/14/2016  . Acute cholecystitis 06/13/2016  . Hypertension 06/13/2016  . Chronic kidney disease 06/13/2016  . Ureterolithiasis   . Dementia   . Constipation 08/30/2011   Past Medical History:  Diagnosis Date  . Acute cholecystitis 05/2016  . Anxiety   . Chronic kidney disease   . Dementia   . Depression   . Dizziness   . Gait abnormality   . Hypertension   . Memory loss   . Tinea pedis   . Weight loss     Family History  Problem Relation Age of Onset  . Heart attack Father     Past Surgical History:  Procedure Laterality Date  . CATARACT EXTRACTION    . IR PERC CHOLECYSTOSTOMY  06/14/2016  . TUBAL LIGATION     Social History   Occupational History  .      retired   Social History Main Topics  . Smoking status: Never Smoker  . Smokeless tobacco: Never Used  . Alcohol use No  . Drug use: No  . Sexual activity: No

## 2016-07-13 LAB — CULTURE, BLOOD (ROUTINE X 2)
CULTURE: NO GROWTH
SPECIAL REQUESTS: ADEQUATE

## 2016-08-28 DEATH — deceased

## 2018-06-05 IMAGING — CT CT ABD-PELV W/ CM
2 of 5 series · 15 of 46 positions shown, 17 images · IV contrast (Omni 300)
Comparison: CT abdomen pelvis 11/17/2015

CLINICAL DATA: Abdominal pain

EXAM:
CT ABDOMEN AND PELVIS WITH CONTRAST
TECHNIQUE: Multidetector CT imaging of the abdomen and pelvis was performed
using the standard protocol following bolus administration of
intravenous contrast.
CONTRAST:  75mL [id number] IOPAMIDOL ([id number]) INJECTION 61%

[Series 3: a/p w/ 5mm · axial · 0.77mm/px · z∈[+776,+1166]mm · 12 of 88 slices shown, 14 images]
[im 5/88  soft-tissue]
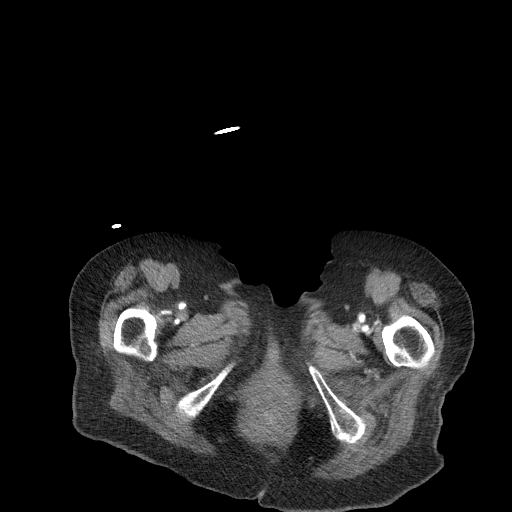
[im 5/88  bone]
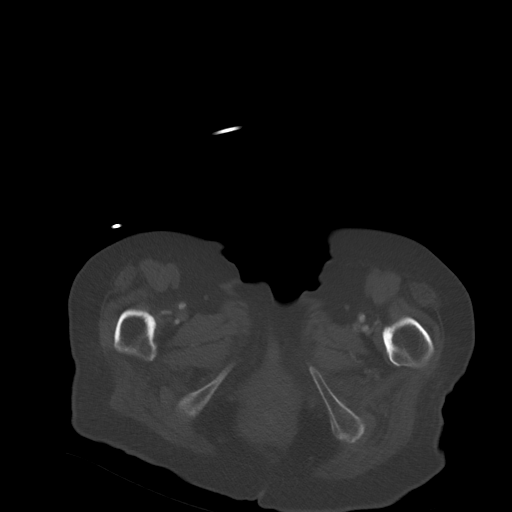
[im 14/88  soft-tissue]
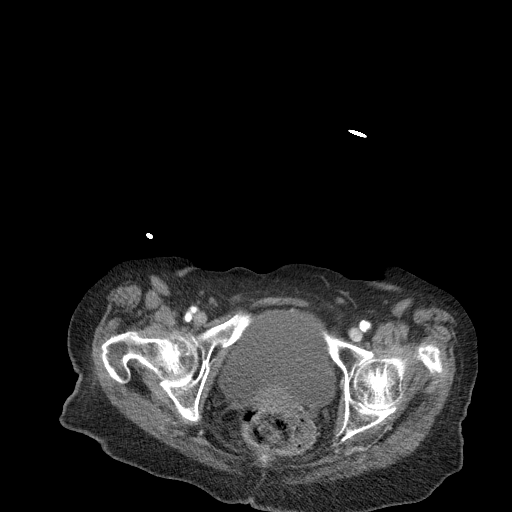
[im 19/88  soft-tissue]
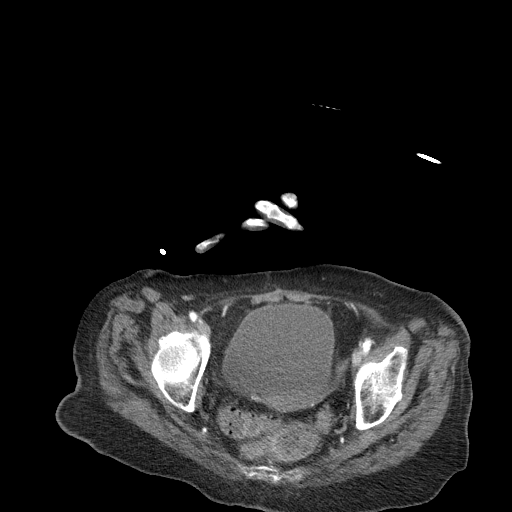
[im 28/88  soft-tissue]
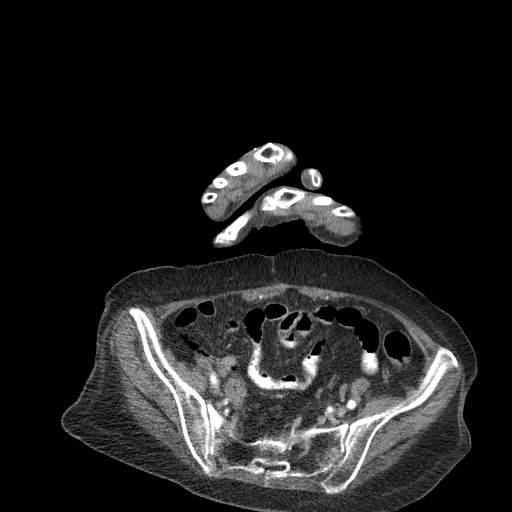
[im 33/88  soft-tissue]
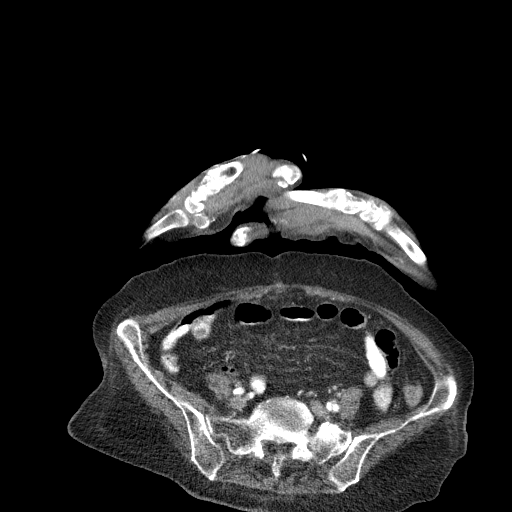
[im 42/88  soft-tissue]
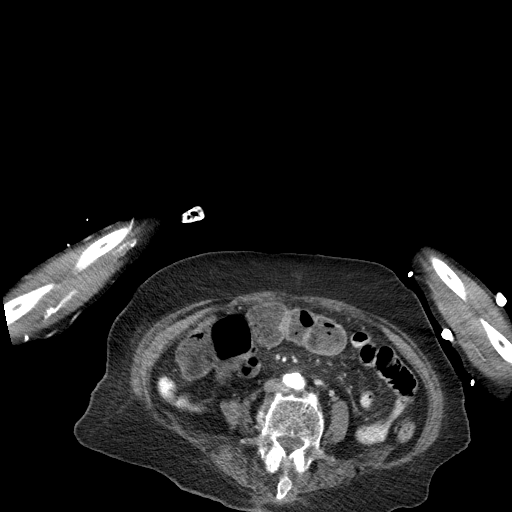
[im 46/88  soft-tissue]
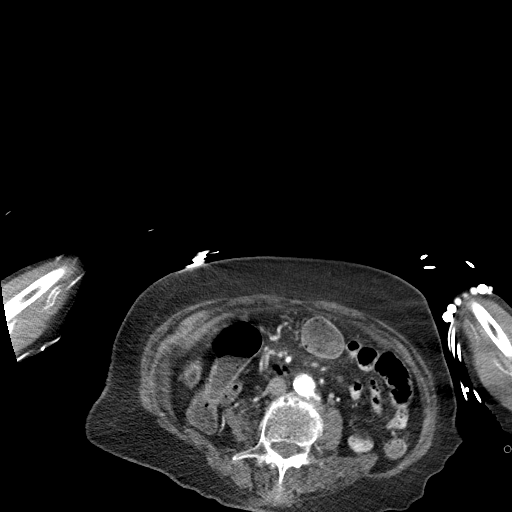
[im 55/88  soft-tissue]
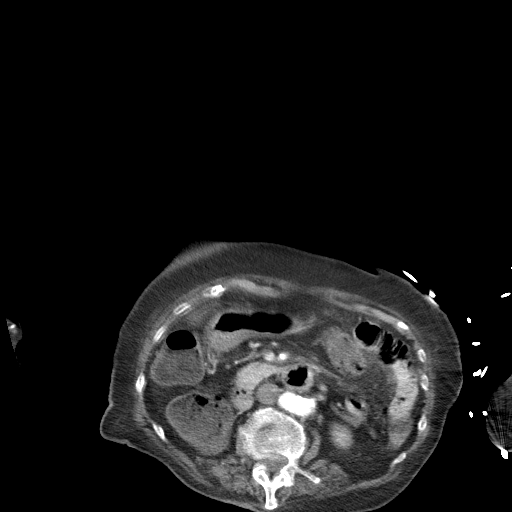
[im 60/88  soft-tissue]
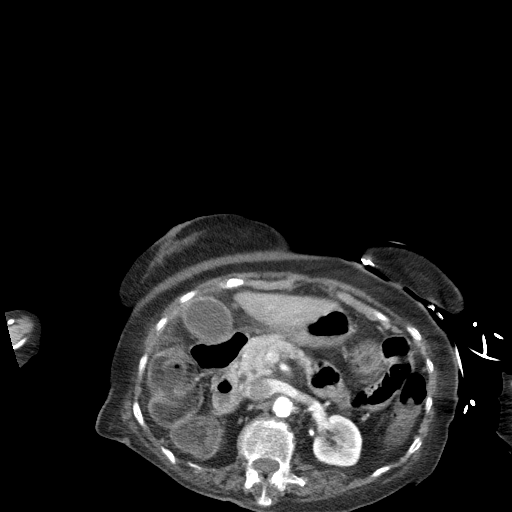
[im 60/88  bone]
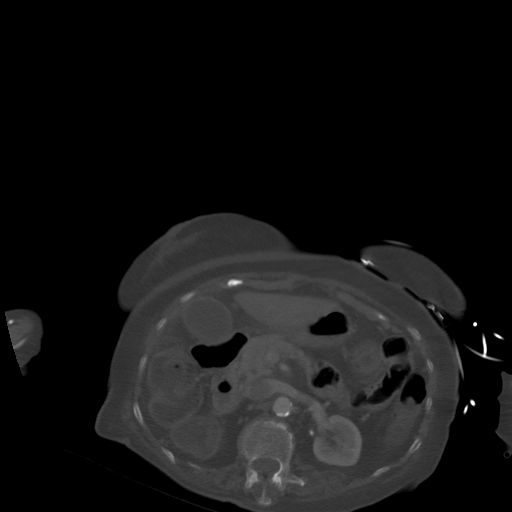
[im 69/88  soft-tissue]
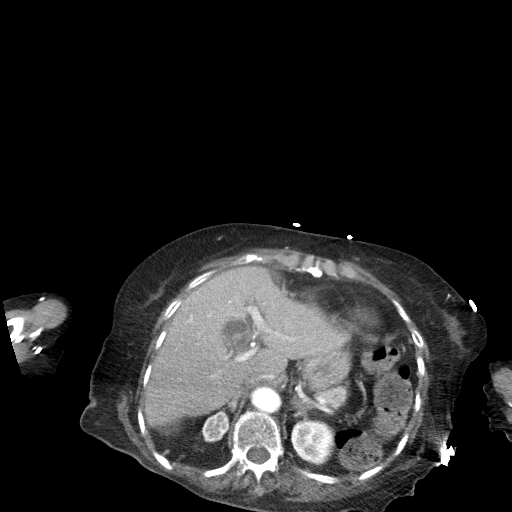
[im 74/88  soft-tissue]
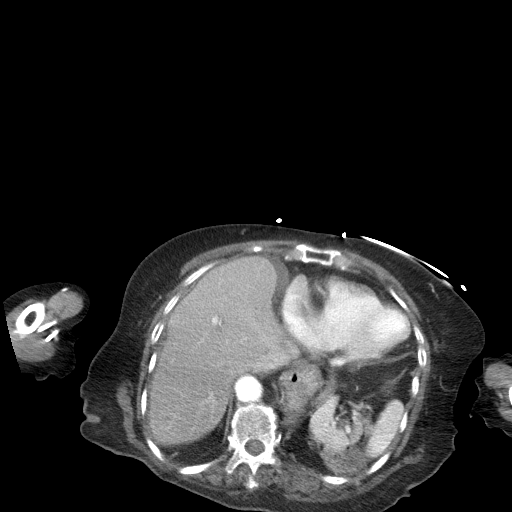
[im 83/88  soft-tissue]
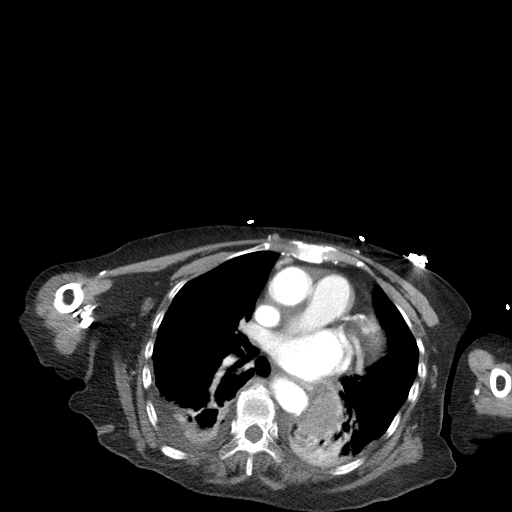

[Series 6: a/p w/ cor · coronal · 0.65mm/px · 3 of 114 slices shown]
[im 38/114  soft-tissue]
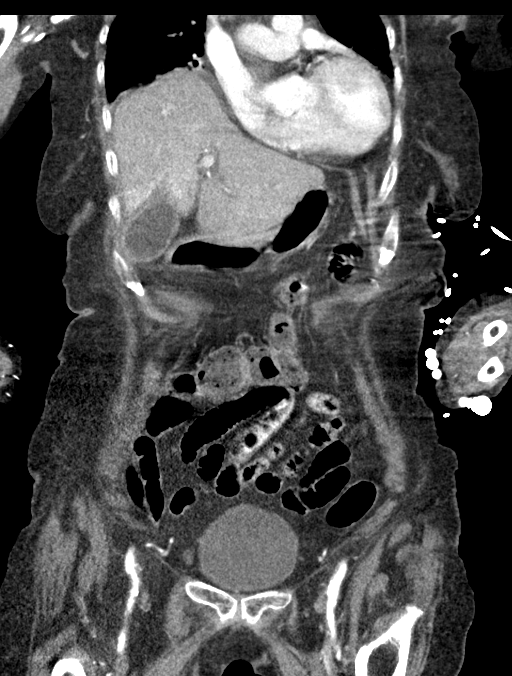
[im 51/114  soft-tissue]
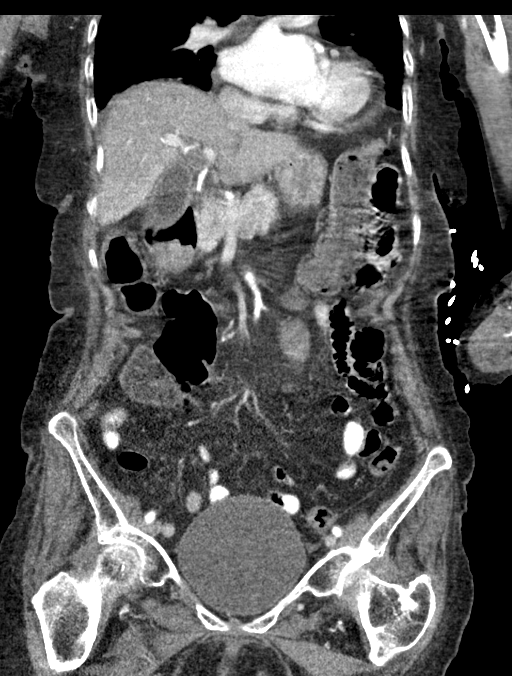
[im 63/114  soft-tissue]
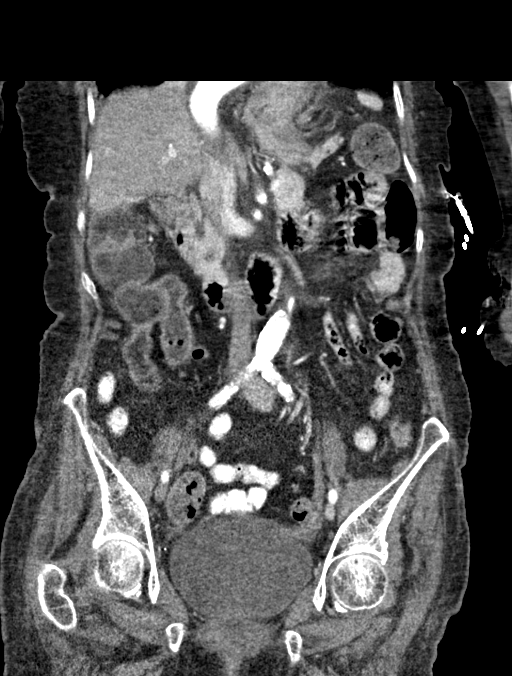

[15 of 46 positions shown; findings below may reference images not displayed]

FINDINGS: Lower chest: There are bilateral small pleural effusions and
associated atelectasis. There are coronary artery calcifications.

Hepatobiliary: Trace perihepatic ascites. There is mild gallbladder
wall edema. There is contrast enhancement of the gallbladder fossa.

Pancreas: Normal pancreatic contours and enhancement. No
peripancreatic fluid collection or pancreatic ductal dilatation.

Spleen: Normal.

Adrenals/Urinary Tract: Normal adrenal glands. Severe right renal
atrophy. There is a 4 mm stone within the proximal left ureter. No
hydronephrosis or perinephric stranding. On the excretory phase
images, however, excreted contrast does pass the level of the stone.

Stomach/Bowel: No abnormal bowel dilatation. No bowel wall
thickening or adjacent fat stranding to indicate acute inflammation.
No abdominal fluid collection. Normal appendix. Small hiatal hernia.

Vascular/Lymphatic: There is atherosclerotic calcification of the
non aneurysmal abdominal aorta. No abdominal or pelvic adenopathy.

Reproductive: Bilateral tubal ligation. Unremarkable uterus. No free
fluid in the pelvis.

Musculoskeletal: There is no bony spinal canal stenosis. No lytic or
blastic lesions. Normal visualized extrathoracic and extraperitoneal
soft tissues.

Other: No contributory non-categorized findings.
IMPRESSION: 1. Gallbladder wall edema and contrast enhancement of the
gallbladder fossa, concerning for acute cholecystitis. Nuclear
medicine hepatobiliary scan may be helpful for confirmation.
2. Left ureterolithiasis with 4 mm stone at the left ureteropelvic
junction with no associated hydronephrosis.
3. Small bilateral pleural effusions and associated atelectasis.
4. Coronary artery and aortic atherosclerosis.
